# Patient Record
Sex: Male | Born: 1960
Health system: Southern US, Community
[De-identification: ages and names within clinical notes are randomized; demographics above are authoritative.]

## PROBLEM LIST (undated history)

## (undated) DIAGNOSIS — K219 Gastro-esophageal reflux disease without esophagitis: Secondary | ICD-10-CM

## (undated) DIAGNOSIS — I10 Essential (primary) hypertension: Secondary | ICD-10-CM

## (undated) DIAGNOSIS — F32A Depression, unspecified: Secondary | ICD-10-CM

## (undated) DIAGNOSIS — Z87442 Personal history of urinary calculi: Secondary | ICD-10-CM

## (undated) DIAGNOSIS — E785 Hyperlipidemia, unspecified: Secondary | ICD-10-CM

## (undated) DIAGNOSIS — F329 Major depressive disorder, single episode, unspecified: Secondary | ICD-10-CM

## (undated) DIAGNOSIS — M545 Low back pain, unspecified: Secondary | ICD-10-CM

## (undated) HISTORY — DX: Depression, unspecified: F32.A

## (undated) HISTORY — DX: Gastro-esophageal reflux disease without esophagitis: K21.9

## (undated) HISTORY — PX: COLONOSCOPY: SHX174

## (undated) HISTORY — DX: Low back pain, unspecified: M54.50

## (undated) HISTORY — DX: Hyperlipidemia, unspecified: E78.5

## (undated) HISTORY — DX: Major depressive disorder, single episode, unspecified: F32.9

## (undated) HISTORY — DX: Personal history of urinary calculi: Z87.442

---

## 2005-11-30 ENCOUNTER — Ambulatory Visit: Payer: Self-pay | Admitting: Internal Medicine

## 2005-12-07 ENCOUNTER — Ambulatory Visit: Payer: Self-pay | Admitting: Internal Medicine

## 2006-04-04 ENCOUNTER — Ambulatory Visit: Payer: Self-pay | Admitting: Internal Medicine

## 2009-03-02 ENCOUNTER — Encounter: Payer: Self-pay | Admitting: Internal Medicine

## 2009-05-26 ENCOUNTER — Ambulatory Visit: Payer: Self-pay | Admitting: Internal Medicine

## 2009-05-26 DIAGNOSIS — N410 Acute prostatitis: Secondary | ICD-10-CM | POA: Insufficient documentation

## 2009-05-26 DIAGNOSIS — Z87442 Personal history of urinary calculi: Secondary | ICD-10-CM | POA: Insufficient documentation

## 2009-07-22 ENCOUNTER — Encounter: Payer: Self-pay | Admitting: Internal Medicine

## 2009-08-25 ENCOUNTER — Ambulatory Visit (HOSPITAL_COMMUNITY): Admission: RE | Admit: 2009-08-25 | Discharge: 2009-08-25 | Payer: Self-pay | Admitting: Urology

## 2009-08-25 ENCOUNTER — Encounter: Payer: Self-pay | Admitting: Internal Medicine

## 2009-09-11 HISTORY — PX: PARTIAL NEPHRECTOMY: SHX414

## 2009-09-21 ENCOUNTER — Encounter (INDEPENDENT_AMBULATORY_CARE_PROVIDER_SITE_OTHER): Payer: Self-pay | Admitting: Urology

## 2009-09-21 ENCOUNTER — Inpatient Hospital Stay (HOSPITAL_COMMUNITY): Admission: RE | Admit: 2009-09-21 | Discharge: 2009-09-28 | Payer: Self-pay | Admitting: Urology

## 2009-10-01 ENCOUNTER — Encounter: Payer: Self-pay | Admitting: Internal Medicine

## 2009-12-21 ENCOUNTER — Encounter: Payer: Self-pay | Admitting: Internal Medicine

## 2010-02-22 ENCOUNTER — Ambulatory Visit: Payer: Self-pay | Admitting: Internal Medicine

## 2010-02-22 DIAGNOSIS — F329 Major depressive disorder, single episode, unspecified: Secondary | ICD-10-CM | POA: Insufficient documentation

## 2010-02-22 DIAGNOSIS — F3289 Other specified depressive episodes: Secondary | ICD-10-CM | POA: Insufficient documentation

## 2010-02-28 ENCOUNTER — Telehealth: Payer: Self-pay | Admitting: Internal Medicine

## 2010-03-09 ENCOUNTER — Telehealth: Payer: Self-pay | Admitting: Internal Medicine

## 2010-03-11 ENCOUNTER — Ambulatory Visit: Payer: Self-pay | Admitting: Internal Medicine

## 2010-03-11 DIAGNOSIS — N529 Male erectile dysfunction, unspecified: Secondary | ICD-10-CM | POA: Insufficient documentation

## 2010-05-18 ENCOUNTER — Ambulatory Visit: Payer: Self-pay | Admitting: Internal Medicine

## 2010-09-02 ENCOUNTER — Ambulatory Visit: Payer: Self-pay | Admitting: Family Medicine

## 2010-09-02 DIAGNOSIS — IMO0002 Reserved for concepts with insufficient information to code with codable children: Secondary | ICD-10-CM

## 2010-09-02 DIAGNOSIS — M25569 Pain in unspecified knee: Secondary | ICD-10-CM | POA: Insufficient documentation

## 2010-09-02 DIAGNOSIS — M159 Polyosteoarthritis, unspecified: Secondary | ICD-10-CM | POA: Insufficient documentation

## 2010-10-03 ENCOUNTER — Ambulatory Visit
Admission: RE | Admit: 2010-10-03 | Discharge: 2010-10-03 | Payer: Self-pay | Source: Home / Self Care | Attending: Family Medicine | Admitting: Family Medicine

## 2010-10-11 NOTE — Letter (Signed)
Summary: [facility]  [facility]   Imported By: Lanelle Bal 01/04/2010 09:50:55  _____________________________________________________________________  External Attachment:    Type:   Image     Comment:   External Document  Appended Document: [facility] healed from oncocytoma surgery checking testosterone and considering replacement therapy

## 2010-10-11 NOTE — Progress Notes (Signed)
Summary: CITALOPRAM HYDROBROMIDE   Phone Note Call from Patient Call back at Home Phone [phone]   Caller: Patient Call For: Cindee Salt MD Summary of Call: Patient has been taking CITALOPRAM HYDROBROMIDE. He does not like the way it make him feel. He has not motivation to do anything. He has been sweating alot without even doing anything. Wants to know if he can try something different. [facility]  Initial call taken by: Melody Comas,  February 28, 2010 10:06 AM  Follow-up for Phone Call        add citalopram to allergy list--just side effects though, not allergy  have him try fluoxetine 10mg  qAM keep follow up even if he only takes this for 2 weeks #30 x 3 Follow-up by: Cindee Salt MD,  February 28, 2010 1:11 PM  Additional Follow-up for Phone Call Additional follow up Details #1::        left message on machine that rx was called in and to keep his follow-up appt. Additional Follow-up by: Mervin Hack CMA Duncan Dull),  February 28, 2010 3:19 PM   New Allergies: CITALOPRAM HYDROBROMIDE (CITALOPRAM HYDROBROMIDE) New/Updated Medications: FLUOXETINE HCL 10 MG CAPS (FLUOXETINE HCL) take 1 by mouth once daily New Allergies: CITALOPRAM HYDROBROMIDE (CITALOPRAM HYDROBROMIDE)Prescriptions: FLUOXETINE HCL 10 MG CAPS (FLUOXETINE HCL) take 1 by mouth once daily  #30 x 3   Entered by:   Mervin Hack CMA (AAMA)   Authorized by:   Cindee Salt MD   Signed by:   Mervin Hack CMA (AAMA) on 02/28/2010   Method used:   [facility]        [facility] [facility]retail)       [address]       [place], [place]  [address]       Ph: [phone] or [phone]       Fax: [phone]   RxID:   [id number]   Prior Medications: TYLENOL PM EXTRA STRENGTH 500-25 MG TABS (DIPHENHYDRAMINE-APAP (SLEEP)) as needed Current Allergies: CITALOPRAM HYDROBROMIDE (CITALOPRAM HYDROBROMIDE)
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
## 2010-10-11 NOTE — Progress Notes (Signed)
Summary: not doing well with medicine  Phone Note Call from Patient Call back at Home Phone [phone]   Caller: Patient Call For: Cindee Salt MD Summary of Call: Pt stopped celexa last week and started generic prozac.  This week he feels overwhelmed, stressed.  He is asking if he should stop the medicine.  He has appt to see you on friday. Initial call taken by: Lowella Petties CMA,  March 09, 2010 9:16 AM  Follow-up for Phone Call        have him stop and we will discuss this on Friday Follow-up by: Cindee Salt MD,  March 09, 2010 10:47 AM  Additional Follow-up for Phone Call Additional follow up Details #1::        Spoke with patient and advised results.  Additional Follow-up by: Mervin Hack CMA Duncan Dull),  March 09, 2010 12:36 PM
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
## 2010-10-13 NOTE — Assessment & Plan Note (Signed)
Summary: 1 MONTH FOLLOW UP/RBH   Vital Signs:  Patient profile:   50 year old male Height:      68 inches Weight:      213.75 pounds BMI:     32.62 Temp:     98.6 degrees F oral Pulse rate:   72 / minute Pulse rhythm:   regular BP sitting:   110 / 70  (right arm) Cuff size:   large  Vitals Entered By: Benny Lennert CMA Duncan Dull) (October 03, 2010 9:29 AM)  History of Present Illness: Chief complaint 1 month up  L knee, f/u inj:  Doing OK No swelling, no pain.   Doing some squats, some leg ext, leg curls.    Essentially better, no real complaints, some weakness in his leg compared to the other, but much, much improved  REVIEW OF SYSTEMS  GEN: No systemic complaints, no fevers, chills, sweats, or other acute illnesses MSK: Detailed in the HPI GI: tolerating PO intake without difficulty Neuro: No numbness, parasthesias, or tingling associated. Otherwise the pertinent positives of the ROS are noted above.    GEN: Well-developed,well-nourished,in no acute distress; alert,appropriate and cooperative throughout examination HEENT: Normocephalic and atraumatic without obvious abnormalities. No apparent alopecia or balding. Ears, externally no deformities PULM: Breathing comfortably in no respiratory distress EXT: No clubbing, cyanosis, or edema PSYCH: Normally interactive. Cooperative during the interview. Pleasant. Friendly and conversant. Not anxious or depressed appearing. Normal, full affect.   Gait: Normal heel toe pattern ROM: WNL Effusion: neg Echymosis or edema: none Patellar tendon NT Painful PLICA: neg Patellar grind: negative Medial and lateral patellar facet loading: negative medial and lateral joint lines:NT Mcmurray's neg Flexion-pinch neg Varus and valgus stress: stable Lachman: neg Ant and Post drawer: neg Hip abduction, IR, ER: WNL Hip flexion str: 5/5 Hip abd: 5/5 Quad: 5/5 VMO atrophy:No Hamstring concentric and eccentric: 5/5   Allergies: 1)   Citalopram Hydrobromide (Citalopram Hydrobromide)   Impression & Recommendations:  Problem # 1:  KNEE PAIN, LEFT, ACUTE (ICD-719.46) Assessment Improved Dramatically better Prob OA flare  His updated medication list for this problem includes:    Diclofenac Sodium 75 Mg Tbec (Diclofenac sodium) .Marland Kitchen... 1 by mouth two times a day with food  Problem # 2:  ARTHRITIS, KNEE (ICD-716.96)  Complete Medication List: 1)  Diclofenac Sodium 75 Mg Tbec (Diclofenac sodium) .Marland Kitchen.. 1 by mouth two times a day with food   Orders Added: 1)  Est. Patient Level III [[address]]    Current Allergies (reviewed today): CITALOPRAM HYDROBROMIDE (CITALOPRAM HYDROBROMIDE)

## 2010-11-07 ENCOUNTER — Encounter: Payer: Self-pay | Admitting: Internal Medicine

## 2010-11-07 DIAGNOSIS — F329 Major depressive disorder, single episode, unspecified: Secondary | ICD-10-CM

## 2010-11-07 DIAGNOSIS — F32A Depression, unspecified: Secondary | ICD-10-CM

## 2010-11-07 DIAGNOSIS — Z87442 Personal history of urinary calculi: Secondary | ICD-10-CM

## 2010-11-21 ENCOUNTER — Ambulatory Visit: Payer: Self-pay | Admitting: Internal Medicine

## 2010-11-23 ENCOUNTER — Encounter: Payer: Self-pay | Admitting: Internal Medicine

## 2010-11-27 LAB — CBC
Hemoglobin: 16.4 g/dL (ref 13.0–17.0)
MCHC: 33.5 g/dL (ref 30.0–36.0)
MCV: 93.5 fL (ref 78.0–100.0)
RBC: 5.24 MIL/uL (ref 4.22–5.81)

## 2010-11-27 LAB — BASIC METABOLIC PANEL
BUN: 7 mg/dL (ref 6–23)
BUN: 8 mg/dL (ref 6–23)
CO2: 26 mEq/L (ref 19–32)
CO2: 29 mEq/L (ref 19–32)
Calcium: 8.1 mg/dL — ABNORMAL LOW (ref 8.4–10.5)
Calcium: 8.2 mg/dL — ABNORMAL LOW (ref 8.4–10.5)
Calcium: 8.3 mg/dL — ABNORMAL LOW (ref 8.4–10.5)
Calcium: 8.4 mg/dL (ref 8.4–10.5)
Calcium: 9.3 mg/dL (ref 8.4–10.5)
Chloride: 106 mEq/L (ref 96–112)
Creatinine, Ser: 1.17 mg/dL (ref 0.4–1.5)
Creatinine, Ser: 1.36 mg/dL (ref 0.4–1.5)
Creatinine, Ser: 1.44 mg/dL (ref 0.4–1.5)
Creatinine, Ser: 1.45 mg/dL (ref 0.4–1.5)
GFR calc Af Amer: >60 mL/min (ref >60)
GFR calc Af Amer: >60 mL/min (ref >60)
GFR calc Af Amer: >60 mL/min (ref >60)
GFR calc non Af Amer: 52 mL/min — ABNORMAL LOW (ref >60)
GFR calc non Af Amer: 52 mL/min — ABNORMAL LOW (ref >60)
GFR calc non Af Amer: 55 mL/min — ABNORMAL LOW (ref >60)
GFR calc non Af Amer: >60 mL/min (ref >60)
Glucose, Bld: 99 mg/dL (ref 70–99)
Potassium: 4.6 mEq/L (ref 3.5–5.1)
Sodium: 136 mEq/L (ref 135–145)

## 2010-11-27 LAB — HEMOGLOBIN AND HEMATOCRIT, BLOOD
HCT: 42 % (ref 39.0–52.0)
HCT: 44.6 % (ref 39.0–52.0)
Hemoglobin: 14.5 g/dL (ref 13.0–17.0)
Hemoglobin: 15.3 g/dL (ref 13.0–17.0)

## 2010-11-27 LAB — CREATININE, FLUID (PLEURAL, PERITONEAL, JP DRAINAGE): Creat, Fluid: 1.3 mg/dL

## 2010-11-27 LAB — TYPE AND SCREEN: Antibody Screen: NEGATIVE

## 2010-11-28 ENCOUNTER — Encounter: Payer: Self-pay | Admitting: Internal Medicine

## 2010-11-28 LAB — CBC
HCT: 39 % (ref 39.0–52.0)
Hemoglobin: 13.4 g/dL (ref 13.0–17.0)
MCHC: 33.7 g/dL (ref 30.0–36.0)
MCHC: 34.3 g/dL (ref 30.0–36.0)
Platelets: 211 10*3/uL (ref 150–400)
RBC: 4.25 MIL/uL (ref 4.22–5.81)
RDW: 12.3 % (ref 11.5–15.5)
RDW: 12.9 % (ref 11.5–15.5)

## 2010-11-28 LAB — COMPREHENSIVE METABOLIC PANEL
ALT: 50 U/L (ref 0–53)
Alkaline Phosphatase: 64 U/L (ref 39–117)
BUN: 10 mg/dL (ref 6–23)
CO2: 24 mEq/L (ref 19–32)
GFR calc non Af Amer: >60 mL/min (ref >60)
Glucose, Bld: 100 mg/dL — ABNORMAL HIGH (ref 70–99)
Potassium: 3.1 mEq/L — ABNORMAL LOW (ref 3.5–5.1)
Sodium: 135 mEq/L (ref 135–145)
Total Protein: 5.6 g/dL — ABNORMAL LOW (ref 6.0–8.3)

## 2010-11-28 LAB — CLOSTRIDIUM DIFFICILE EIA: C difficile Toxins A+B, EIA: NEGATIVE

## 2010-11-28 LAB — DIFFERENTIAL
Basophils Absolute: 0 10*3/uL (ref 0.0–0.1)
Basophils Relative: 1 % (ref 0–1)
Eosinophils Absolute: 0.1 10*3/uL (ref 0.0–0.7)
Monocytes Relative: 11 % (ref 3–12)
Neutro Abs: 6.6 10*3/uL (ref 1.7–7.7)
Neutrophils Relative %: 72 % (ref 43–77)

## 2010-11-28 LAB — CREATININE, SERUM
Creatinine, Ser: 1.21 mg/dL (ref 0.4–1.5)
GFR calc non Af Amer: >60 mL/min (ref >60)

## 2010-11-29 ENCOUNTER — Ambulatory Visit (INDEPENDENT_AMBULATORY_CARE_PROVIDER_SITE_OTHER): Payer: PRIVATE HEALTH INSURANCE | Admitting: Internal Medicine

## 2010-11-29 ENCOUNTER — Encounter: Payer: Self-pay | Admitting: Internal Medicine

## 2010-11-29 VITALS — BP 110/60 | HR 80 | Temp 98.6°F | Wt 206.0 lb

## 2010-11-29 DIAGNOSIS — F329 Major depressive disorder, single episode, unspecified: Secondary | ICD-10-CM

## 2010-11-29 DIAGNOSIS — F3289 Other specified depressive episodes: Secondary | ICD-10-CM

## 2010-11-29 NOTE — Assessment & Plan Note (Signed)
Mood is better now Less stress at work with new assistant Occ sleep problems--discussed OTC meds Clearly this requires no Rx
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
## 2010-11-29 NOTE — Patient Instructions (Signed)
Please try melatonin at night if you continue to have sleep problems

## 2011-06-01 ENCOUNTER — Ambulatory Visit (INDEPENDENT_AMBULATORY_CARE_PROVIDER_SITE_OTHER): Payer: PRIVATE HEALTH INSURANCE | Admitting: Internal Medicine

## 2011-06-01 ENCOUNTER — Encounter: Payer: Self-pay | Admitting: Internal Medicine

## 2011-06-01 VITALS — BP 129/61 | HR 74 | Temp 98.5°F | Ht 68.0 in | Wt 211.0 lb

## 2011-06-01 DIAGNOSIS — F329 Major depressive disorder, single episode, unspecified: Secondary | ICD-10-CM

## 2011-06-01 DIAGNOSIS — F3289 Other specified depressive episodes: Secondary | ICD-10-CM

## 2011-06-01 DIAGNOSIS — Z0000 Encounter for general adult medical examination without abnormal findings: Secondary | ICD-10-CM

## 2011-06-01 NOTE — Progress Notes (Signed)
Subjective:    Patient ID: Harold Li, male    DOB: Jul 27, 1961, 50 y.o.   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 sildenafil (VIAGRA) 100 MG tablet Take 50-100 mg by mouth daily as needed. Take 30-60 minutes before sex         Allergies  Allergen Reactions  . Citalopram Hydrobromide     REACTION: no energy, weak    Past Medical History  Diagnosis Date  . Depression   . History of nephrolithiasis     Dr. Earlene Plater    Past Surgical History  Procedure Date  . 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 Coronary artery disease Father   . Arthritis Father   . Diabetes Mother   . Arthritis Mother     History   Social History  . Marital Status: Single    Spouse Name: N/A    Number of Children: N/A  . Years of Education: N/A   Occupational History  . 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 Smoking status: Former Smoker    Types: Cigarettes    Quit date: 08/11/2009  . Smokeless tobacco: Never Used  . Alcohol Use: Yes  . Drug Use: Not on file  . Sexually Active: Not on file   Other Topics Concern  . Not on file   Social History Narrative  . No narrative on file   Review of Systems  Constitutional: Negative for fatigue and unexpected weight change.       Wears seat belt  HENT: Negative for hearing loss, congestion, rhinorrhea, dental problem and tinnitus.        Regular with dentist  Eyes: Negative for visual disturbance.       No diplopia or unilateral vision loss  Respiratory: Negative for cough, chest tightness and shortness of breath.   Cardiovascular: Positive for palpitations. Negative for chest pain and leg swelling.    Rare heart fluttering when at rest---goes back many years [facility] regularly [facility] [employer]---feels his fitness is fine  Gastrointestinal: Negative for nausea, vomiting, abdominal pain, constipation and blood in stool.       No heartburn  Genitourinary: Negative for dysuria, frequency and difficulty urinating.       ED not a current issue  Musculoskeletal: Positive for back pain and arthralgias. Negative for joint swelling.       Some knee and back pain Happy with chiropractor  Skin: Negative for rash.       Sees derm yearly  Neurological: Negative for dizziness, syncope, weakness, light-headedness, numbness and headaches.  Hematological: Negative for adenopathy. Does not bruise/bleed easily.  Psychiatric/Behavioral: Negative for sleep disturbance and dysphoric mood. The patient is not nervous/anxious.        Objective:   Physical Exam  Constitutional: He is oriented to person, place, and time. He appears well-developed and well-nourished. No distress.  HENT:  Head: Normocephalic and atraumatic.  Right Ear: External ear normal.  Left Ear: External ear normal.  Mouth/Throat: Oropharynx is clear and moist. No oropharyngeal exudate.       TMs normal  Eyes: Conjunctivae and EOM are normal. Pupils are  equal, round, and reactive to light.       Fundi benign  Neck: Normal range of motion. Neck supple. No thyromegaly present.  Cardiovascular: Normal rate, regular rhythm, normal heart sounds and intact distal pulses.  Exam reveals no gallop.   No murmur heard. Pulmonary/Chest: Effort normal and breath sounds normal. No respiratory distress. He has no wheezes. He has no rales.  Abdominal: Soft. There is no tenderness.  Musculoskeletal: Normal range of motion. He exhibits no edema and no tenderness.  Lymphadenopathy:    He has no cervical adenopathy.  Neurological: He is alert and oriented to person, place, and time. He exhibits normal muscle tone.       Normal strength and gait  Skin: Skin is  warm. No rash noted.  Psychiatric: He has a normal mood and affect. His behavior is normal. Judgment and thought content normal.          Assessment & Plan:

## 2011-06-01 NOTE — Patient Instructions (Signed)
Please do the stool test and send it back

## 2011-06-01 NOTE — Assessment & Plan Note (Signed)
Healthy Doing well Requests stool immunoassay due to cost (has high deductible) Discussed PSA--will check

## 2011-06-01 NOTE — Assessment & Plan Note (Signed)
Better now No seasonality now No Rx  Check labs

## 2011-06-02 ENCOUNTER — Other Ambulatory Visit: Payer: PRIVATE HEALTH INSURANCE

## 2011-06-02 ENCOUNTER — Telehealth: Payer: Self-pay | Admitting: Family Medicine

## 2011-06-02 LAB — CBC WITH DIFFERENTIAL/PLATELET
Basophils Relative: 0.5 % (ref 0.0–3.0)
Eosinophils Absolute: 0.1 10*3/uL (ref 0.0–0.7)
Eosinophils Relative: 1.9 % (ref 0.0–5.0)
HCT: 48.4 % (ref 39.0–52.0)
Hemoglobin: 15.9 g/dL (ref 13.0–17.0)
MCHC: 32.9 g/dL (ref 30.0–36.0)
MCV: 92.9 fl (ref 78.0–100.0)
Monocytes Absolute: 0.4 10*3/uL (ref 0.1–1.0)
Neutro Abs: 3.2 10*3/uL (ref 1.4–7.7)
Neutrophils Relative %: 58.2 % (ref 43.0–77.0)
RBC: 5.21 Mil/uL (ref 4.22–5.81)
WBC: 5.6 10*3/uL (ref 4.5–10.5)

## 2011-06-02 LAB — BASIC METABOLIC PANEL
CO2: 28 mEq/L (ref 19–32)
Chloride: 104 mEq/L (ref 96–112)
Creatinine, Ser: 1.2 mg/dL (ref 0.4–1.5)
Potassium: 4.5 mEq/L (ref 3.5–5.1)
Sodium: 140 mEq/L (ref 135–145)

## 2011-06-02 LAB — HEPATIC FUNCTION PANEL
ALT: 28 U/L (ref 0–53)
AST: 30 U/L (ref 0–37)
Total Bilirubin: 0.5 mg/dL (ref 0.3–1.2)
Total Protein: 7.3 g/dL (ref 6.0–8.3)

## 2011-06-02 LAB — LIPID PANEL: Total CHOL/HDL Ratio: 5

## 2011-06-02 LAB — LDL CHOLESTEROL, DIRECT: Direct LDL: 164.5 mg/dL

## 2011-06-02 LAB — PSA: PSA: 0.54 ng/mL (ref 0.10–4.00)

## 2011-06-02 NOTE — Telephone Encounter (Signed)
Message copied by Judy Pimple on Fri Jun 02, 2011  8:14 AM ------      Message from: Baldomero Lamy      Created: Fri Jun 02, 2011  7:56 AM      Regarding: Cpx labs this am       Pt scheduled for cpx labs this am please order      Thanks      Rodney Booze

## 2011-06-08 ENCOUNTER — Other Ambulatory Visit: Payer: Self-pay | Admitting: Internal Medicine

## 2011-06-08 ENCOUNTER — Other Ambulatory Visit: Payer: PRIVATE HEALTH INSURANCE

## 2011-06-08 DIAGNOSIS — Z1211 Encounter for screening for malignant neoplasm of colon: Secondary | ICD-10-CM

## 2011-06-12 ENCOUNTER — Encounter: Payer: Self-pay | Admitting: *Deleted

## 2013-08-20 ENCOUNTER — Encounter: Payer: Self-pay | Admitting: Internal Medicine

## 2013-08-20 ENCOUNTER — Ambulatory Visit (INDEPENDENT_AMBULATORY_CARE_PROVIDER_SITE_OTHER): Payer: Managed Care, Other (non HMO) | Admitting: Internal Medicine

## 2013-08-20 VITALS — BP 126/78 | HR 97 | Temp 98.9°F | Wt 218.2 lb

## 2013-08-20 DIAGNOSIS — J019 Acute sinusitis, unspecified: Secondary | ICD-10-CM

## 2013-08-20 MED ORDER — AMOXICILLIN 500 MG PO TABS: 1000.0000 mg | ORAL_TABLET | Freq: Two times a day (BID) | ORAL | Status: DC

## 2013-08-20 NOTE — Progress Notes (Signed)
   Subjective:    Patient ID: Harold Li, male    DOB: June 26, 1961, 52 y.o.   MRN: [id number]  HPI Has a bad cold 3rd time recently Had in October--seemed to be better but still congested Then bad again in November Now back again  Congestion Dry cough Slight sore throat Does have post nasal drip No ear pain Some fever--- one day No real SOB Some myalgia now  Using alka seltzer plus-- day and night (slight help)   Current Outpatient Prescriptions on File Prior to Visit  Medication Sig Dispense Refill  . sildenafil (VIAGRA) 100 MG tablet Take 50-100 mg by mouth daily as needed. Take 30-60 minutes before sex        No current facility-administered medications on file prior to visit.    Allergies  Allergen Reactions  . Citalopram Hydrobromide     REACTION: no energy, weak    Past Medical History  Diagnosis Date  . Depression   . History of nephrolithiasis     Dr. Earlene Plater    Past Surgical History  Procedure Laterality Date  . Partial nephrectomy  09/2009    for oncocytoma    Family History  Problem Relation Age of Onset  . Coronary artery disease Father   . Arthritis Father   . Diabetes Mother   . Arthritis Mother     History   Social History  . Marital Status: Single    Spouse Name: N/A    Number of Children: N/A  . Years of Education: N/A   Occupational History  . 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 Smoking status: Former Smoker    Types: Cigarettes    Quit date: 08/11/2009  . Smokeless tobacco: Never Used  . Alcohol Use: Yes  . Drug Use: Not on file  . Sexual Activity: Not on file   Other Topics Concern  . Not on file   Social History Narrative  . No narrative on file   Review of Systems No rash No vomiting or diarrhea Appetite is good     Objective:   Physical Exam  Constitutional: He appears well-developed and well-nourished. No distress.  HENT:  Mouth/Throat: Oropharynx is clear and moist. No  oropharyngeal exudate.  No sinus tenderness Moderate nasal inflammation TMs normal  Neck: Normal range of motion. Neck supple.  Pulmonary/Chest: Effort normal and breath sounds normal. No respiratory distress. He has no wheezes. He has no rales.  Lymphadenopathy:    He has no cervical adenopathy.          Assessment & Plan:

## 2013-08-20 NOTE — Assessment & Plan Note (Signed)
Recurrent over 2 months Discussed symptomatic Rx Will treat with amoxicillin

## 2013-08-20 NOTE — Progress Notes (Signed)
Pre-visit discussion using our clinic review tool. No additional management support is needed unless otherwise documented below in the visit note.  

## 2014-01-19 ENCOUNTER — Encounter: Payer: Self-pay | Admitting: Internal Medicine

## 2014-01-19 ENCOUNTER — Ambulatory Visit (INDEPENDENT_AMBULATORY_CARE_PROVIDER_SITE_OTHER): Payer: Managed Care, Other (non HMO) | Admitting: Internal Medicine

## 2014-01-19 ENCOUNTER — Encounter: Payer: Self-pay | Admitting: Gastroenterology

## 2014-01-19 VITALS — BP 118/70 | HR 57 | Temp 98.6°F | Ht 68.0 in | Wt 192.0 lb

## 2014-01-19 DIAGNOSIS — Z0000 Encounter for general adult medical examination without abnormal findings: Secondary | ICD-10-CM

## 2014-01-19 DIAGNOSIS — Z1211 Encounter for screening for malignant neoplasm of colon: Secondary | ICD-10-CM

## 2014-01-19 DIAGNOSIS — E785 Hyperlipidemia, unspecified: Secondary | ICD-10-CM

## 2014-01-19 DIAGNOSIS — Z125 Encounter for screening for malignant neoplasm of prostate: Secondary | ICD-10-CM

## 2014-01-19 LAB — LIPID PANEL
Cholesterol: 196 mg/dL (ref 0–200)
HDL: 57.7 mg/dL (ref >39.00)
LDL CALC: 127 mg/dL — AB (ref 0–99)
Total CHOL/HDL Ratio: 3
Triglycerides: 58 mg/dL (ref 0.0–149.0)
VLDL: 11.6 mg/dL (ref 0.0–40.0)

## 2014-01-19 LAB — T4, FREE: FREE T4: 0.91 ng/dL (ref 0.60–1.60)

## 2014-01-19 LAB — TSH: TSH: 1.42 u[IU]/mL (ref 0.35–4.50)

## 2014-01-19 LAB — COMPREHENSIVE METABOLIC PANEL
ALK PHOS: 72 U/L (ref 39–117)
ALT: 53 U/L (ref 0–53)
AST: 38 U/L — AB (ref 0–37)
Albumin: 4.4 g/dL (ref 3.5–5.2)
BILIRUBIN TOTAL: 0.7 mg/dL (ref 0.2–1.2)
BUN: 19 mg/dL (ref 6–23)
CO2: 29 meq/L (ref 19–32)
Calcium: 9.6 mg/dL (ref 8.4–10.5)
Chloride: 103 mEq/L (ref 96–112)
Creatinine, Ser: 1.1 mg/dL (ref 0.4–1.5)
GFR: 77.95 mL/min (ref >60.00)
Glucose, Bld: 86 mg/dL (ref 70–99)
Potassium: 4.6 mEq/L (ref 3.5–5.1)
SODIUM: 139 meq/L (ref 135–145)
TOTAL PROTEIN: 6.9 g/dL (ref 6.0–8.3)

## 2014-01-19 LAB — PSA: PSA: 1 ng/mL (ref 0.10–4.00)

## 2014-01-19 LAB — CBC WITH DIFFERENTIAL/PLATELET
BASOS ABS: 0 10*3/uL (ref 0.0–0.1)
Basophils Relative: 0.5 % (ref 0.0–3.0)
EOS ABS: 0.1 10*3/uL (ref 0.0–0.7)
Eosinophils Relative: 2 % (ref 0.0–5.0)
HEMATOCRIT: 45.3 % (ref 39.0–52.0)
Hemoglobin: 15.4 g/dL (ref 13.0–17.0)
LYMPHS ABS: 2.1 10*3/uL (ref 0.7–4.0)
Lymphocytes Relative: 32.5 % (ref 12.0–46.0)
MCHC: 34 g/dL (ref 30.0–36.0)
MCV: 90.9 fl (ref 78.0–100.0)
MONO ABS: 0.5 10*3/uL (ref 0.1–1.0)
Monocytes Relative: 7.2 % (ref 3.0–12.0)
NEUTROS ABS: 3.7 10*3/uL (ref 1.4–7.7)
Neutrophils Relative %: 57.8 % (ref 43.0–77.0)
PLATELETS: 232 10*3/uL (ref 150.0–400.0)
RBC: 4.99 Mil/uL (ref 4.22–5.81)
RDW: 13 % (ref 11.5–15.5)
WBC: 6.5 10*3/uL (ref 4.0–10.5)

## 2014-01-19 NOTE — Progress Notes (Signed)
Subjective:    Patient ID: Harold Li, male    DOB: 02/18/1961, 53 y.o.   MRN: [id number]  HPI Doing well  Has started again with [employer] Goes to gym 5 days per week Has lost 26# since the last visit  Same job Usual stress but no big deal  Some knee pain from arthritis Moderates exercise depending on how much it hurts Uses ibuprofen if the pain is bad  Current Outpatient Prescriptions on File Prior to Visit  Medication Sig Dispense Refill  . sildenafil (VIAGRA) 100 MG tablet Take 50-100 mg by mouth daily as needed. Take 30-60 minutes before sex        No current facility-administered medications on file prior to visit.    Allergies  Allergen Reactions  . Citalopram Hydrobromide     REACTION: no energy, weak    Past Medical History  Diagnosis Date  . Depression   . History of nephrolithiasis     Dr. Rosana Hoes  . Hyperlipidemia     Past Surgical History  Procedure Laterality Date  . Partial nephrectomy  09/2009    for oncocytoma    Family History  Problem Relation Age of Onset  . Coronary artery disease Father   . Arthritis Father   . Stroke Father     in eye  . Diabetes Mother   . Arthritis Mother     History   Social History  . Marital Status: Single    Spouse Name: N/A    Number of Children: N/A  . Years of Education: N/A   Occupational History  . 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 Smoking status: Former Smoker    Types: Cigarettes    Quit date: 08/11/2009  . Smokeless tobacco: Never Used  . Alcohol Use: Yes  . Drug Use: Not on file  . Sexual Activity: Not on file   Other Topics Concern  . Not on file   Social History Narrative  . No narrative on file   Review of Systems  Constitutional: Negative for fatigue.       Wears seat belt Just got married in January  HENT: Negative for dental problem, hearing loss and tinnitus.        Regular with dentist  Eyes: Negative for visual disturbance.       No  diplopia or unilateral vision loss  Respiratory: Negative for cough, chest tightness and shortness of breath.   Cardiovascular: Positive for palpitations. Negative for chest pain and leg swelling.       Rare palpitations--at rest  Gastrointestinal: Negative for nausea, vomiting, abdominal pain, constipation and blood in stool.       Occasional heartburn--no meds  Endocrine: Negative for cold intolerance and heat intolerance.  Genitourinary: Negative for urgency, frequency and difficulty urinating.       Libido down since his kidney surgery  Musculoskeletal: Positive for arthralgias. Negative for back pain and joint swelling.  Skin: Negative for rash.       Sensitive to sun Sees derm every 6 months  Allergic/Immunologic: Negative for environmental allergies and immunocompromised state.  Neurological: Negative for dizziness, syncope, weakness, light-headedness, numbness and headaches.  Hematological: Negative for adenopathy. Bruises/bleeds easily.  Psychiatric/Behavioral: Negative for sleep disturbance and dysphoric mood. The patient is not nervous/anxious.        Objective:   Physical Exam  Constitutional: He is oriented to person, place, and time. He appears well-developed and well-nourished. No distress.  HENT:  Head: Normocephalic and atraumatic.  Right Ear: External ear normal.  Left Ear: External ear normal.  Mouth/Throat: Oropharynx is clear and moist. No oropharyngeal exudate.  Eyes: Conjunctivae and EOM are normal. Pupils are equal, round, and reactive to light.  Neck: Normal range of motion. Neck supple. No thyromegaly present.  Cardiovascular: Normal rate, regular rhythm, normal heart sounds and intact distal pulses.   No murmur heard. Pulmonary/Chest: Effort normal and breath sounds normal. No respiratory distress. He has no wheezes. He has no rales.  Abdominal: Soft. There is no tenderness.  Musculoskeletal: He exhibits no edema and no tenderness.  Lymphadenopathy:     He has no cervical adenopathy.  Neurological: He is alert and oriented to person, place, and time.  Skin: No rash noted. No erythema.  Psychiatric: He has a normal mood and affect. His behavior is normal.          Assessment & Plan:

## 2014-01-19 NOTE — Assessment & Plan Note (Signed)
Will recheck given his lifestyle changes

## 2014-01-19 NOTE — Progress Notes (Signed)
Pre visit review using our clinic review tool, if applicable. No additional management support is needed unless otherwise documented below in the visit note. 

## 2014-01-19 NOTE — Assessment & Plan Note (Signed)
Has done great work on fitness Will check sugar

## 2014-03-09 ENCOUNTER — Encounter: Payer: Managed Care, Other (non HMO) | Admitting: Gastroenterology

## 2014-03-26 ENCOUNTER — Ambulatory Visit (AMBULATORY_SURGERY_CENTER): Payer: Self-pay

## 2014-03-26 VITALS — Ht 68.0 in | Wt 186.0 lb

## 2014-03-26 DIAGNOSIS — Z1211 Encounter for screening for malignant neoplasm of colon: Secondary | ICD-10-CM

## 2014-03-26 MED ORDER — MOVIPREP 100 G PO SOLR: 1.0000 | Freq: Once | ORAL | Status: DC

## 2014-03-26 NOTE — Progress Notes (Signed)
No allergies to eggs or soy No home oxygen No past problems with anesthesia No diet/weight loss meds  Has email.  Emmi instructions given for colonoscopy. 

## 2014-03-30 ENCOUNTER — Encounter: Payer: Self-pay | Admitting: Gastroenterology

## 2014-04-10 ENCOUNTER — Ambulatory Visit (AMBULATORY_SURGERY_CENTER): Payer: Managed Care, Other (non HMO) | Admitting: Gastroenterology

## 2014-04-10 ENCOUNTER — Encounter: Payer: Self-pay | Admitting: Gastroenterology

## 2014-04-10 VITALS — BP 127/83 | HR 61 | Temp 97.0°F | Resp 16 | Ht 68.0 in | Wt 186.0 lb

## 2014-04-10 DIAGNOSIS — D126 Benign neoplasm of colon, unspecified: Secondary | ICD-10-CM

## 2014-04-10 DIAGNOSIS — D124 Benign neoplasm of descending colon: Secondary | ICD-10-CM

## 2014-04-10 DIAGNOSIS — Z1211 Encounter for screening for malignant neoplasm of colon: Secondary | ICD-10-CM

## 2014-04-10 MED ORDER — SODIUM CHLORIDE 0.9 % IV SOLN: 500.0000 mL | INTRAVENOUS | Status: DC

## 2014-04-10 NOTE — Progress Notes (Signed)
Called to room to assist during endoscopic procedure.  Patient ID and intended procedure confirmed with present staff. Received instructions for my participation in the procedure from the performing physician.  

## 2014-04-10 NOTE — Patient Instructions (Signed)
YOU HAD AN ENDOSCOPIC PROCEDURE TODAY AT THE [facility]: Refer to the procedure report that was given to you for any specific questions about what was found during the examination.  If the procedure report does not answer your questions, please call your gastroenterologist to clarify.  If you requested that your care partner not be given the details of your procedure findings, then the procedure report has been included in a sealed envelope for you to review at your convenience later.  YOU SHOULD EXPECT: Some feelings of bloating in the abdomen. Passage of more gas than usual.  Walking can help get rid of the air that was put into your GI tract during the procedure and reduce the bloating. If you had a lower endoscopy (such as a colonoscopy or flexible sigmoidoscopy) you may notice spotting of blood in your stool or on the toilet paper. If you underwent a bowel prep for your procedure, then you may not have a normal bowel movement for a few days.  DIET: Your first meal following the procedure should be a light meal and then it is ok to progress to your normal diet.  A half-sandwich or bowl of soup is an example of a good first meal.  Heavy or fried foods are harder to digest and may make you feel nauseous or bloated.  Likewise meals heavy in dairy and vegetables can cause extra gas to form and this can also increase the bloating.  Drink plenty of fluids but you should avoid alcoholic beverages for 24 hours.  ACTIVITY: Your care partner should take you home directly after the procedure.  You should plan to take it easy, moving slowly for the rest of the day.  You can resume normal activity the day after the procedure however you should NOT DRIVE or use heavy machinery for 24 hours (because of the sedation medicines used during the test).    SYMPTOMS TO REPORT IMMEDIATELY: A gastroenterologist can be reached at any hour.  During normal business hours, 8:30 AM to 5:00 PM Monday through Friday,  call [phone].  After hours and on weekends, please call the GI answering service at [phone] who will take a message and have the physician on call contact you.   Following lower endoscopy (colonoscopy or flexible sigmoidoscopy):  Excessive amounts of blood in the stool  Significant tenderness or worsening of abdominal pains  Swelling of the abdomen that is new, acute  Fever of 100F or higher FOLLOW UP: If any biopsies were taken you will be contacted by phone or by letter within the next 1-3 weeks.  Call your gastroenterologist if you have not heard about the biopsies in 3 weeks.  Our staff will call the home number listed on your records the next business day following your procedure to check on you and address any questions or concerns that you may have at that time regarding the information given to you following your procedure. This is a courtesy call and so if there is no answer at the home number and we have not heard from you through the emergency physician on call, we will assume that you have returned to your regular daily activities without incident.  SIGNATURES/CONFIDENTIALITY: You and/or your care partner have signed paperwork which will be entered into your electronic medical record.  These signatures attest to the fact that that the information above on your After Visit Summary has been reviewed and is understood.  Full responsibility of the confidentiality of this discharge   information lies with you and/or your care-partner.  Polyp information given. 

## 2014-04-10 NOTE — Op Note (Signed)
[place] [facility]  [address] [place], [id number]   COLONOSCOPY PROCEDURE REPORT  PATIENT: Harold, Li  MR#: [id number] BIRTHDATE: August 06, 1961 , 37  yrs. old GENDER: Male ENDOSCOPIST: Milus Banister, MD REFERRED [id number] Silvio Pate, M.D. PROCEDURE DATE:  04/10/2014 PROCEDURE:   Colonoscopy with snare polypectomy First Screening Colonoscopy - Avg.  risk and is 50 yrs.  old or older Yes.  Prior Negative Screening - Now for repeat screening. N/A  History of Adenoma - Now for follow-up colonoscopy & has been > or = to 3 yrs.  N/A  Polyps Removed Today? Yes. ASA CLASS:   Class II INDICATIONS:average risk screening. MEDICATIONS: MAC sedation, administered by CRNA and Propofol (Diprivan) 280 mg IV  DESCRIPTION OF PROCEDURE:   After the risks benefits and alternatives of the procedure were thoroughly explained, informed consent was obtained.  A digital rectal exam revealed no abnormalities of the rectum.   The LB [id number]  endoscope was introduced through the anus and advanced to the cecum, which was identified by both the appendix and ileocecal valve. No adverse events experienced.   The quality of the prep was excellent.  The instrument was then slowly withdrawn as the colon was fully examined.   COLON FINDINGS: One polyp was found, removed and sent to pathology. This was sessile, located in descending segment 81mm across, removed with cold snare.  The examination was otherwise normal. Retroflexed views revealed no abnormalities. The time to cecum=2 minutes 48 seconds.  Withdrawal time=10 minutes 00 seconds.  The scope was withdrawn and the procedure completed. COMPLICATIONS: There were no complications.  ENDOSCOPIC IMPRESSION: One polyp was found, removed and sent to pathology. The examination was otherwise normal.  RECOMMENDATIONS: If the polyp(s) removed today are proven to be adenomatous (pre-cancerous) polyps, you will need a repeat colonoscopy in  5 years.  Otherwise you should continue to follow colorectal cancer screening guidelines for "routine risk" patients with colonoscopy in 10 years.  You will receive a letter within 1-2 weeks with the results of your biopsy as well as final recommendations.  Please call my office if you have not received a letter after 3 weeks.   eSigned:  Milus Banister, MD 04/10/2014 10:35 AM

## 2014-04-10 NOTE — Progress Notes (Signed)
Procedure ends, to recovery, report given and VSS. 

## 2014-04-13 ENCOUNTER — Telehealth: Payer: Self-pay | Admitting: *Deleted

## 2014-04-13 NOTE — Telephone Encounter (Signed)
  Follow up Call-  Call back number 04/10/2014  Post procedure Call Back phone  # [phone]  Permission to leave phone message Yes    [facility]

## 2014-04-17 ENCOUNTER — Encounter: Payer: Self-pay | Admitting: Gastroenterology

## 2014-07-07 ENCOUNTER — Ambulatory Visit (INDEPENDENT_AMBULATORY_CARE_PROVIDER_SITE_OTHER): Payer: Managed Care, Other (non HMO) | Admitting: Internal Medicine

## 2014-07-07 ENCOUNTER — Encounter: Payer: Self-pay | Admitting: Internal Medicine

## 2014-07-07 VITALS — BP 130/72 | HR 68 | Temp 98.0°F | Wt 196.0 lb

## 2014-07-07 DIAGNOSIS — R6884 Jaw pain: Secondary | ICD-10-CM

## 2014-07-07 DIAGNOSIS — M2662 Arthralgia of temporomandibular joint: Secondary | ICD-10-CM

## 2014-07-07 DIAGNOSIS — M26629 Arthralgia of temporomandibular joint, unspecified side: Secondary | ICD-10-CM

## 2014-07-07 NOTE — Progress Notes (Signed)
Subjective:    Patient ID: Harold Li, male    DOB: 1961/07/24, 53 y.o.   MRN: [id number]  HPI  Pt presents to the clinic today with c/o right side jaw pain. He reports this started about 6 weeks ago. He has pain with opening his mouth and chewing.He has not heard any popping noises. He has noticed some swelling in that area. He denies pain in his teeth. He denies fever or chills. He has had some ear pain on that side but that has resolved. He has never had anything like this before. He has not tried anything OTC.  Review of Systems      Past Medical History  Diagnosis Date  . Depression   . History of nephrolithiasis     Dr. Rosana Hoes  . Hyperlipidemia     No current outpatient prescriptions on file.   No current facility-administered medications for this visit.    Allergies  Allergen Reactions  . Citalopram Hydrobromide     REACTION: no energy, weak    Family History  Problem Relation Age of Onset  . Coronary artery disease Father   . Arthritis Father   . Stroke Father     in eye  . Diabetes Mother   . Arthritis Mother   . Colon cancer Neg Hx   . Pancreatic cancer Neg Hx   . Rectal cancer Neg Hx   . Stomach cancer Neg Hx     History   Social History  . Marital Status: Married    Spouse Name: N/A    Number of Children: N/A  . Years of Education: N/A   Occupational History  . 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 Smoking status: Former Smoker    Types: Cigarettes    Quit date: 08/11/2009  . Smokeless tobacco: Never Used  . Alcohol Use: Yes     Comment: extrememly rare about twice yearly  . Drug Use: No  . Sexual Activity: Not on file   Other Topics Concern  . Not on file   Social History Narrative   Married 1/15     Constitutional: Denies fever, malaise, fatigue, headache or abrupt weight changes.  HEENT: Denies eye pain, eye redness, ear pain, ringing in the ears, wax buildup, runny nose, nasal congestion, bloody  nose, or sore throat. Respiratory: Denies difficulty breathing, shortness of breath, cough or sputum production.   Musculoskeletal: Pt reports right side jaw pain. Denies decrease in range of motion, difficulty with gait, muscle pain.    No other specific complaints in a complete review of systems (except as listed in HPI above).  Objective:   Physical Exam   BP 130/72  Pulse 68  Temp(Src) 98 F (36.7 C) (Oral)  Wt 196 lb (88.905 kg)  SpO2 98% Wt Readings from Last 3 Encounters:  07/07/14 196 lb (88.905 kg)  04/10/14 186 lb (84.369 kg)  03/26/14 186 lb (84.369 kg)    General: Appears his stated age, well developed, well nourished in NAD. Neck: Normal range of motion. Neck supple, trachea midline. No masses, lumps or thyromegaly present.  Cardiovascular: Normal rate and rhythm. S1,S2 noted.  No murmur, rubs or gallops noted.  Pulmonary/Chest: Normal effort and positive vesicular breath sounds. No respiratory distress. No wheezes, rales or ronchi noted.  Musculoskeletal: Tender to palpation over the right TMJ. Clicking noted with opening and closing of the jaw. Mild swelling noted on the right side of the face.  BMET    Component Value Date/Time   NA 139 01/19/2014 1227   K 4.6 01/19/2014 1227   CL 103 01/19/2014 1227   CO2 29 01/19/2014 1227   GLUCOSE 86 01/19/2014 1227   BUN 19 01/19/2014 1227   CREATININE 1.1 01/19/2014 1227   CALCIUM 9.6 01/19/2014 1227   GFRNONAA >60 09/28/2009 0440   GFRAA  Value: >60        The eGFR has been calculated using the MDRD equation. This calculation has not been validated in all clinical situations. eGFR's persistently <60 mL/min signify possible Chronic Kidney Disease. 09/28/2009 0440    Lipid Panel     Component Value Date/Time   CHOL 196 01/19/2014 1227   TRIG 58.0 01/19/2014 1227   HDL 57.70 01/19/2014 1227   CHOLHDL 3 01/19/2014 1227   VLDL 11.6 01/19/2014 1227   LDLCALC 127* 01/19/2014 1227    CBC    Component Value Date/Time   WBC  6.5 01/19/2014 1227   RBC 4.99 01/19/2014 1227   HGB 15.4 01/19/2014 1227   HCT 45.3 01/19/2014 1227   PLT 232.0 01/19/2014 1227   MCV 90.9 01/19/2014 1227   MCHC 34.0 01/19/2014 1227   RDW 13.0 01/19/2014 1227   LYMPHSABS 2.1 01/19/2014 1227   MONOABS 0.5 01/19/2014 1227   EOSABS 0.1 01/19/2014 1227   BASOSABS 0.0 01/19/2014 1227    Hgb A1C No results found for this basename: HGBA1C        Assessment & Plan:   Right side jaw pain, ? TMJ:  Try wearing a mouth guard which can be purchased at your local drug store Try some Advil for the inflammation and swelling Try chewing softer foods over the next 1-2 weeks  If no improvement, follow up with PCP

## 2014-07-07 NOTE — Progress Notes (Signed)
Pre visit review using our clinic review tool, if applicable. No additional management support is needed unless otherwise documented below in the visit note. 

## 2014-07-07 NOTE — Patient Instructions (Addendum)
Temporomandibular Problems  Temporomandibular joint (TMJ) dysfunction means there are problems with the joint between your jaw and your skull. This is a joint lined by cartilage like other joints in your body but also has a small disc in the joint which keeps the bones from rubbing on each other. These joints are like other joints and can get inflamed (sore) from arthritis and other problems. When this joint gets sore, it can cause headaches and pain in the jaw and the face. CAUSES  Usually the arthritic types of problems are caused by soreness in the joint. Soreness in the joint can also be caused by overuse. This may come from grinding your teeth. It may also come from mis-alignment in the joint. DIAGNOSIS Diagnosis of this condition can often be made by history and exam. Sometimes your caregiver may need X-rays or an MRI scan to determine the exact cause. It may be necessary to see your dentist to determine if your teeth and jaws are lined up correctly. TREATMENT  Most of the time this problem is not serious; however, sometimes it can persist (become chronic). When this happens medications that will cut down on inflammation (soreness) help. Sometimes a shot of cortisone into the joint will be helpful. If your teeth are not aligned it may help for your dentist to make a splint for your mouth that can help this problem. If no physical problems can be found, the problem may come from tension. If tension is found to be the cause, biofeedback or relaxation techniques may be helpful. HOME CARE INSTRUCTIONS   Later in the day, applications of ice packs may be helpful. Ice can be used in a plastic bag with a towel around it to prevent frostbite to skin. This may be used about every 2 hours for 20 to 30 minutes, as needed while awake, or as directed by your caregiver.  Only take over-the-counter or prescription medicines for pain, discomfort, or fever as directed by your caregiver.  If physical therapy was  prescribed, follow your caregiver's directions.  Wear mouth appliances as directed if they were given. Document Released: 05/23/2001 Document Revised: 11/20/2011 Document Reviewed: 08/30/2008 ExitCare Patient Information 2015 [employer]. This information is not intended to replace advice given to you by your health care provider. Make sure you discuss any questions you have with your health care provider.  

## 2015-02-11 ENCOUNTER — Telehealth: Payer: Self-pay | Admitting: Internal Medicine

## 2015-02-11 NOTE — Telephone Encounter (Signed)
Pt insurance company changed and is requesting letter on physicians letterhead stating and age-appropriate physical with lab work has been performed. Please call pt on cell phone when complete.

## 2015-02-11 NOTE — Telephone Encounter (Signed)
It has been over a year so I suspect that won't work. I can write the letter but his exam was May 2015. Set up appt if he needs it

## 2015-02-12 ENCOUNTER — Encounter: Payer: Self-pay | Admitting: Internal Medicine

## 2015-02-12 NOTE — Telephone Encounter (Signed)
I left a message on patient's voice mail letter is ready for pick up and there's no charge.

## 2015-02-12 NOTE — Telephone Encounter (Signed)
Harold Li told patient he was due for a physical, but patient said they just need a letter for last year.

## 2015-02-12 NOTE — Telephone Encounter (Signed)
Done No charge 

## 2015-12-06 ENCOUNTER — Encounter: Payer: Self-pay | Admitting: Family Medicine

## 2015-12-06 ENCOUNTER — Ambulatory Visit (INDEPENDENT_AMBULATORY_CARE_PROVIDER_SITE_OTHER): Payer: 59 | Admitting: Family Medicine

## 2015-12-06 VITALS — BP 122/78 | HR 82 | Temp 98.4°F | Ht 68.0 in | Wt 195.4 lb

## 2015-12-06 DIAGNOSIS — B349 Viral infection, unspecified: Secondary | ICD-10-CM | POA: Diagnosis not present

## 2015-12-06 LAB — POCT INFLUENZA A/B
Influenza A, POC: NEGATIVE
Influenza B, POC: NEGATIVE

## 2015-12-06 NOTE — Progress Notes (Signed)
Pre visit review using our clinic review tool, if applicable. No additional management support is needed unless otherwise documented below in the visit note. 

## 2015-12-06 NOTE — Assessment & Plan Note (Signed)
New acute problem.  Rapid flu negative today. I doubt the patient has influenza given lack of true fever. However, there are false negatives. I advised him to contact us if he runs a fever throughout the day and we will treat him empirically for flu. He's had no known exposures to the flu. His wife is currently sick.  This is likely viral illness. Advised supportive care - tylenol/ibuprofen, rest, fluids.

## 2015-12-06 NOTE — Patient Instructions (Signed)
Your flu test was negative today.  This is likely just a viral illness.   Continue supportive care - Tylenol/Ibuprofen, rest, fluids.  If you continue to have fever please let us know.  Take care  Dr. Lacinda Axon

## 2015-12-06 NOTE — Progress Notes (Signed)
Subjective:  Patient ID: Harold Li, male    DOB: 11-25-1960  Age: 55 y.o. MRN: [id number]  CC: Cough, Body Aches, Low grade fever  HPI:  55 year old male presents to clinic today with the above complaints.  Patient states that he has not felt well since yesterday. He's been experiencing nonproductive cough, bodyaches, low-grade fever (100.1 this am). He took Tylenol this morning with some improvement. He has a known sick contacts as his wife has been sick recently. He did not receive a flu shot this year. No known exacerbating factors. No other complaints at this time.  Social Hx   Social History   Social History  . Marital Status: Married    Spouse Name: N/A  . Number of Children: N/A  . Years of Education: N/A   Occupational History  . 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 Smoking status: Former Smoker    Types: Cigarettes    Quit date: 08/11/2009  . Smokeless tobacco: Never Used  . Alcohol Use: Yes     Comment: extrememly rare about twice yearly  . Drug Use: No  . Sexual Activity: Not Asked   Other Topics Concern  . None   Social History Narrative   Married 1/15   Review of Systems  Respiratory: Positive for cough.   Musculoskeletal:       Body aches.   Objective:  BP 122/78 mmHg  Pulse 82  Temp(Src) 98.4 F (36.9 C) (Oral)  Ht 5\' 8"  (1.727 m)  Wt 195 lb 6 oz (88.622 kg)  BMI 29.71 kg/m2  SpO2 96%  BP/Weight 12/06/2015 07/07/2014 [id number]  Systolic BP [id number]  Diastolic BP 78 72 83  Wt. (Lbs) 195.38 196 186  BMI 29.71 29.81 28.29   Physical Exam  Constitutional: He is oriented to person, place, and time. He appears well-developed. No distress.  HENT:  Mouth/Throat: Oropharynx is clear and moist.  R TM with slight effusion. Normal Left TM.  Eyes: Conjunctivae are normal. No scleral icterus.  Neck: Neck supple.  Cardiovascular: Normal rate and regular rhythm.   No murmur heard. Pulmonary/Chest: Effort normal and  breath sounds normal. He has no wheezes. He has no rales.  Lymphadenopathy:    He has no cervical adenopathy.  Neurological: He is alert and oriented to person, place, and time.  Vitals reviewed.  Lab Results  Component Value Date   WBC 6.5 01/19/2014   HGB 15.4 01/19/2014   HCT 45.3 01/19/2014   PLT 232.0 01/19/2014   GLUCOSE 86 01/19/2014   CHOL 196 01/19/2014   TRIG 58.0 01/19/2014   HDL 57.70 01/19/2014   LDLDIRECT 164.5 06/02/2011   LDLCALC 127* 01/19/2014   ALT 53 01/19/2014   AST 38* 01/19/2014   NA 139 01/19/2014   K 4.6 01/19/2014   CL 103 01/19/2014   CREATININE 1.1 01/19/2014   BUN 19 01/19/2014   CO2 29 01/19/2014   TSH 1.42 01/19/2014   PSA 1.00 01/19/2014    Assessment & Plan:   Problem List Items Addressed This Visit    Viral illness - Primary    New acute problem.  Rapid flu negative today. I doubt the patient has influenza given lack of true fever. However, there are false negatives. I advised him to contact us if he runs a fever throughout the day and we will treat him empirically for flu. He's had no known exposures to the flu. His wife is currently  sick.  This is likely viral illness. Advised supportive care - tylenol/ibuprofen, rest, fluids.      Relevant Orders   POCT Influenza A/B (Completed)     Follow-up: PRN  [place]

## 2016-04-25 ENCOUNTER — Ambulatory Visit (INDEPENDENT_AMBULATORY_CARE_PROVIDER_SITE_OTHER): Payer: 59 | Admitting: Internal Medicine

## 2016-04-25 ENCOUNTER — Encounter: Payer: Self-pay | Admitting: Internal Medicine

## 2016-04-25 VITALS — BP 120/78 | HR 66 | Temp 98.4°F | Ht 68.0 in | Wt 191.0 lb

## 2016-04-25 DIAGNOSIS — Z23 Encounter for immunization: Secondary | ICD-10-CM

## 2016-04-25 DIAGNOSIS — Z125 Encounter for screening for malignant neoplasm of prostate: Secondary | ICD-10-CM | POA: Diagnosis not present

## 2016-04-25 DIAGNOSIS — Z0000 Encounter for general adult medical examination without abnormal findings: Secondary | ICD-10-CM

## 2016-04-25 DIAGNOSIS — E785 Hyperlipidemia, unspecified: Secondary | ICD-10-CM

## 2016-04-25 DIAGNOSIS — Z1159 Encounter for screening for other viral diseases: Secondary | ICD-10-CM

## 2016-04-25 LAB — COMPREHENSIVE METABOLIC PANEL
ALBUMIN: 4.4 g/dL (ref 3.5–5.2)
ALK PHOS: 65 U/L (ref 39–117)
ALT: 27 U/L (ref 0–53)
AST: 25 U/L (ref 0–37)
BUN: 17 mg/dL (ref 6–23)
CO2: 28 mEq/L (ref 19–32)
CREATININE: 1.1 mg/dL (ref 0.40–1.50)
Calcium: 9.5 mg/dL (ref 8.4–10.5)
Chloride: 105 mEq/L (ref 96–112)
GFR: 73.27 mL/min (ref >60.00)
Glucose, Bld: 92 mg/dL (ref 70–99)
Potassium: 4.6 mEq/L (ref 3.5–5.1)
SODIUM: 140 meq/L (ref 135–145)
TOTAL PROTEIN: 6.7 g/dL (ref 6.0–8.3)
Total Bilirubin: 0.6 mg/dL (ref 0.2–1.2)

## 2016-04-25 LAB — PSA: PSA: 0.59 ng/mL (ref 0.10–4.00)

## 2016-04-25 LAB — CBC WITH DIFFERENTIAL/PLATELET
Basophils Absolute: 0 10*3/uL (ref 0.0–0.1)
Basophils Relative: 0.3 % (ref 0.0–3.0)
EOS ABS: 0.1 10*3/uL (ref 0.0–0.7)
Eosinophils Relative: 1.5 % (ref 0.0–5.0)
HCT: 44.8 % (ref 39.0–52.0)
HEMOGLOBIN: 15.1 g/dL (ref 13.0–17.0)
Lymphocytes Relative: 38 % (ref 12.0–46.0)
Lymphs Abs: 1.9 10*3/uL (ref 0.7–4.0)
MCHC: 33.7 g/dL (ref 30.0–36.0)
MCV: 89.9 fl (ref 78.0–100.0)
MONO ABS: 0.4 10*3/uL (ref 0.1–1.0)
Monocytes Relative: 8.1 % (ref 3.0–12.0)
Neutro Abs: 2.6 10*3/uL (ref 1.4–7.7)
Neutrophils Relative %: 52.1 % (ref 43.0–77.0)
Platelets: 231 10*3/uL (ref 150.0–400.0)
RBC: 4.98 Mil/uL (ref 4.22–5.81)
RDW: 13.4 % (ref 11.5–15.5)
WBC: 5 10*3/uL (ref 4.0–10.5)

## 2016-04-25 LAB — T4, FREE: FREE T4: 0.86 ng/dL (ref 0.60–1.60)

## 2016-04-25 LAB — LIPID PANEL
CHOL/HDL RATIO: 3
Cholesterol: 193 mg/dL (ref 0–200)
HDL: 56 mg/dL (ref >39.00)
LDL Cholesterol: 123 mg/dL — ABNORMAL HIGH (ref 0–99)
NONHDL: 137.44
TRIGLYCERIDES: 70 mg/dL (ref 0.0–149.0)
VLDL: 14 mg/dL (ref 0.0–40.0)

## 2016-04-25 NOTE — Addendum Note (Signed)
Addended by: Emelia Salisbury C on: 04/25/2016 11:08 AM   Modules accepted: Orders

## 2016-04-25 NOTE — Progress Notes (Signed)
Pre visit review using our clinic review tool, if applicable. No additional management support is needed unless otherwise documented below in the visit note. 

## 2016-04-25 NOTE — Assessment & Plan Note (Signed)
Was better last time with weight loss Will check again today

## 2016-04-25 NOTE — Assessment & Plan Note (Signed)
Healthy Working on fitness Colon due 2020 Will check PSA Tdap today

## 2016-04-25 NOTE — Progress Notes (Signed)
Subjective:    Patient ID: Harold Li, male    DOB: 02-Feb-1961, 55 y.o.   MRN: [id number]  HPI Here for physical  Diagnosed with carpal tunnel on right Has brace and on meloxicam Still having some finger numbness Has follow up next week--Murphy Wainer  Mild ankle problem on left  Seems to be improving Grabs on lateral side at times Doing better since changed from boots to shoes  No current outpatient prescriptions on file prior to visit.   No current facility-administered medications on file prior to visit.     Allergies  Allergen Reactions  . Citalopram Hydrobromide     REACTION: no energy, weak    Past Medical History:  Diagnosis Date  . Depression   . History of nephrolithiasis    Dr. Rosana Hoes  . Hyperlipidemia     Past Surgical History:  Procedure Laterality Date  . 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 Coronary artery disease Father   . Arthritis Father   . Stroke Father     in eye  . Diabetes Mother   . Arthritis Mother   . Colon cancer Neg Hx   . Pancreatic cancer Neg Hx   . Rectal cancer Neg Hx   . Stomach cancer Neg Hx     Social History   Social History  . Marital status: Married    Spouse name: N/A  . Number of children: N/A  . Years of education: N/A   Occupational History  .  [employer], occupational supply   Social History Main Topics  . Smoking status: Former Smoker    Types: Cigarettes    Quit date: 08/11/2009  . Smokeless tobacco: Never Used  . Alcohol use Yes     Comment: extrememly rare about twice yearly  . Drug use: No  . Sexual activity: Not on file   Other Topics Concern  . Not on file   Social History Narrative   Married 1/15   Review of Systems  Constitutional: Negative for fatigue and unexpected weight change.       Has kept weight down Back to working out at gym Wears seat belt  HENT: Negative for dental problem,  hearing loss and tinnitus.        Wears ear protection daily at work Keeps up with dentist  Eyes: Negative for visual disturbance.       No diplopia or unilateral vision loss  Respiratory: Negative for cough, chest tightness and shortness of breath.   Cardiovascular: Negative for chest pain and leg swelling.       Occ palpitations at rest  Gastrointestinal: Negative for abdominal pain, blood in stool, constipation, nausea and vomiting.       No heartburn   Endocrine: Negative for polydipsia and polyuria.  Genitourinary: Negative for difficulty urinating, frequency and urgency.       No sexual problems  Musculoskeletal: Negative for back pain and joint swelling.  Skin: Negative for rash.       No suspicious lesions--goes to derm at least once a year  Allergic/Immunologic: Positive for environmental allergies. Negative for immunocompromised state.       Occ OTC meds  Neurological: Negative for dizziness, syncope, weakness, light-headedness and headaches.  Hematological: Negative for adenopathy. Bruises/bleeds easily.  Psychiatric/Behavioral: Negative for dysphoric mood and sleep disturbance. The patient is not nervous/anxious.  Objective:   Physical Exam  Constitutional: He is oriented to person, place, and time. He appears well-developed and well-nourished. No distress.  HENT:  Head: Normocephalic and atraumatic.  Right Ear: External ear normal.  Left Ear: External ear normal.  Mouth/Throat: Oropharynx is clear and moist. No oropharyngeal exudate.  Eyes: Conjunctivae are normal. Pupils are equal, round, and reactive to light.  Neck: Normal range of motion. Neck supple. No thyromegaly present.  Cardiovascular: Normal rate, regular rhythm, normal heart sounds and intact distal pulses.  Exam reveals no gallop.   No murmur heard. Pulmonary/Chest: Effort normal and breath sounds normal. No respiratory distress. He has no wheezes. He has no rales.  Abdominal: Soft. There is no  tenderness.  Musculoskeletal: He exhibits no edema or tenderness.  Lymphadenopathy:    He has no cervical adenopathy.  Neurological: He is alert and oriented to person, place, and time.  Skin: No rash noted. No erythema.  Psychiatric: He has a normal mood and affect. His behavior is normal.          Assessment & Plan:

## 2016-04-26 LAB — HEPATITIS C ANTIBODY: HCV Ab: REACTIVE — AB

## 2016-05-01 ENCOUNTER — Other Ambulatory Visit: Payer: Self-pay | Admitting: Internal Medicine

## 2016-05-01 ENCOUNTER — Telehealth: Payer: Self-pay | Admitting: *Deleted

## 2016-05-01 DIAGNOSIS — R768 Other specified abnormal immunological findings in serum: Secondary | ICD-10-CM

## 2016-05-01 LAB — HEPATITIS C RNA QUANTITATIVE

## 2016-05-01 NOTE — Telephone Encounter (Signed)
I sent new orders to [facility]--- already notified me Has he been set up for redraw?

## 2016-05-01 NOTE — Telephone Encounter (Signed)
Harold Li with [name] called: Quantity of blood not sufficient for HCV Quantitative drawn last week. Will need re-redraw.

## 2016-05-02 NOTE — Telephone Encounter (Signed)
Pt scheduled for 8.23.17,

## 2016-05-03 ENCOUNTER — Other Ambulatory Visit: Payer: 59

## 2016-05-03 NOTE — Addendum Note (Signed)
Addended by: Ellamae Sia on: 05/03/2016 04:20 PM   Modules accepted: Orders

## 2016-05-08 ENCOUNTER — Other Ambulatory Visit: Payer: Self-pay | Admitting: Internal Medicine

## 2016-05-08 ENCOUNTER — Other Ambulatory Visit (INDEPENDENT_AMBULATORY_CARE_PROVIDER_SITE_OTHER): Payer: 59

## 2016-05-08 DIAGNOSIS — R768 Other specified abnormal immunological findings in serum: Secondary | ICD-10-CM

## 2016-05-08 DIAGNOSIS — R894 Abnormal immunological findings in specimens from other organs, systems and tissues: Secondary | ICD-10-CM

## 2016-05-09 LAB — HEPATITIS C ANTIBODY: HCV AB: REACTIVE — AB

## 2016-05-10 LAB — LIVER FIBROSIS, FIBROTEST-ACTITEST
ALT: 25 U/L (ref 9–46)
APOLIPOPROTEIN A1: 151 mg/dL (ref 94–176)
Alpha-2-Macroglobulin: 141 mg/dL (ref 106–279)
Bilirubin: 0.5 mg/dL (ref 0.2–1.2)
Fibrosis Score: 0.2
GGT: 13 U/L (ref 3–85)
HAPTOGLOBIN: 34 mg/dL — AB (ref 43–212)
Necroinflammat ACT Score: 0.1
Reference ID: 1616154

## 2016-05-10 LAB — HEPATITIS C RNA QUANTITATIVE: HCV Quantitative: <15 IU/mL (ref <15)

## 2016-09-21 DIAGNOSIS — L570 Actinic keratosis: Secondary | ICD-10-CM | POA: Diagnosis not present

## 2016-10-13 ENCOUNTER — Encounter: Payer: Self-pay | Admitting: Family Medicine

## 2016-10-13 ENCOUNTER — Ambulatory Visit (INDEPENDENT_AMBULATORY_CARE_PROVIDER_SITE_OTHER): Payer: 59 | Admitting: Family Medicine

## 2016-10-13 VITALS — BP 130/74 | HR 73 | Temp 98.3°F | Resp 16 | Ht 68.0 in | Wt 200.5 lb

## 2016-10-13 DIAGNOSIS — R350 Frequency of micturition: Secondary | ICD-10-CM

## 2016-10-13 DIAGNOSIS — N410 Acute prostatitis: Secondary | ICD-10-CM

## 2016-10-13 DIAGNOSIS — Z87442 Personal history of urinary calculi: Secondary | ICD-10-CM

## 2016-10-13 LAB — POCT URINALYSIS DIPSTICK
Bilirubin, UA: NEGATIVE
Glucose, UA: NEGATIVE
KETONES UA: NEGATIVE
Leukocytes, UA: NEGATIVE
Nitrite, UA: NEGATIVE
Protein, UA: NEGATIVE
UROBILINOGEN UA: NEGATIVE
pH, UA: 6

## 2016-10-13 MED ORDER — SULFAMETHOXAZOLE-TRIMETHOPRIM 800-160 MG PO TABS: 1.0000 | ORAL_TABLET | Freq: Two times a day (BID) | ORAL | 0 refills | Status: DC

## 2016-10-13 NOTE — Progress Notes (Signed)
BP 130/74   Pulse 73   Temp 98.3 F (36.8 C) (Oral)   Resp 16   Ht 5\' 8"  (1.727 m)   Wt 200 lb 8 oz (90.9 kg)   SpO2 97%   BMI 30.49 kg/m    CC: urinary frequency Subjective:    Patient ID: Harold Li, male    DOB: 07-Aug-1961, 56 y.o.   MRN: [id number]  HPI: Harold Li is a 56 y.o. male presenting on 10/13/2016 for Urinary Frequency (1 month, discomfort, unable to provide sufficient amount, kidney stone in the past )   1 mo h/o persistent dysuria, discomfort, incomplete emptying, increased nocturia. Initially intermittent symptoms, now more persistent. Some bladder discomfort.   Hasn't tried anything for this, did take tylenol last night.   No fevers/chills, nausea/vomiting, abd pain or flank pain, rectal pain. No hematuria.   H/o kidney stones, this doesn't feel similar.  H/o prostatitis years ago.   H/o partial nephrectomy for oncocytoma 2011. Hasn't seen Dr Alinda Money in 5 yrs.   Relevant past medical, surgical, family and social history reviewed and updated as indicated. Interim medical history since our last visit reviewed. Allergies and medications reviewed and updated. No current outpatient prescriptions on file prior to visit.   No current facility-administered medications on file prior to visit.     Review of Systems Per HPI unless specifically indicated in ROS section     Objective:    BP 130/74   Pulse 73   Temp 98.3 F (36.8 C) (Oral)   Resp 16   Ht 5\' 8"  (1.727 m)   Wt 200 lb 8 oz (90.9 kg)   SpO2 97%   BMI 30.49 kg/m   Wt Readings from Last 3 Encounters:  10/13/16 200 lb 8 oz (90.9 kg)  04/25/16 191 lb (86.6 kg)  12/06/15 195 lb 6 oz (88.6 kg)    Physical Exam  Constitutional: He appears well-developed and well-nourished. No distress.  Abdominal: Soft. Normal appearance and bowel sounds are normal. He exhibits no distension and no mass. There is no hepatosplenomegaly. There is tenderness (mild) in the periumbilical area and suprapubic area.  There is no rebound, no guarding and no CVA tenderness.  Genitourinary: Rectum normal. Rectal exam shows no external hemorrhoid, no internal hemorrhoid, no fissure, no mass, no tenderness and anal tone normal. Prostate is tender. Prostate is not enlarged (20gm).  Genitourinary Comments: Boggy tender prostate on exam, no asymmetry or induration or nodularity however  Psychiatric: He has a normal mood and affect.  Nursing note and vitals reviewed.  Results for orders placed or performed in visit on 10/13/16  POCT urinalysis dipstick  Result Value Ref Range   Color, UA Yellow    Clarity, UA Clear    Glucose, UA Negative    Bilirubin, UA Negative    Ketones, UA Negative    Spec Grav, UA >=1.030    Blood, UA 1+    pH, UA 6.0    Protein, UA Negative    Urobilinogen, UA negative    Nitrite, UA Negative    Leukocytes, UA Negative Negative      Assessment & Plan:   Problem List Items Addressed This Visit    Acute prostatitis with hematuria - Primary    New. Treat with 2wk bactrim course. UCx sent.  Update if sxs persist or fail to improve UA with 1+ blood but micro without abnormal amt RBC/hpf.  No f/u needed unless sxs persist. Pt agrees with plan.  Relevant Orders   Urine culture    Other Visit Diagnoses    Frequent urination       Relevant Orders   POCT urinalysis dipstick (Completed)       Follow up plan: Return if symptoms worsen or fail to improve.  Ria Bush, MD

## 2016-10-13 NOTE — Patient Instructions (Addendum)
You have prostate infection Treat with antibiotic 2 wk course.  Push fluids and rest. Avoid bladder irritants.  Let us know if symptoms don't fully resolve with treatment.   Prostatitis Prostatitis is swelling or inflammation of the prostate gland. The prostate is a walnut-sized gland that is involved in the production of semen. It is located below a man's bladder, in front of the rectum. There are four types of prostatitis:  Chronic nonbacterial prostatitis. This is the most common type of prostatitis. It may be associated with a viral infection or autoimmune disorder.  Acute bacterial prostatitis. This is the least common type of prostatitis. It starts quickly and is usually associated with a bladder infection, high fever, and shaking chills. It can occur at any age.  Chronic bacterial prostatitis. This type usually results from acute bacterial prostatitis that happens repeatedly (is recurrent) or has not been treated properly. It can occur in men of any age but is most common among middle-aged men whose prostate has begun to get larger. The symptoms are not as severe as symptoms caused by acute bacterial prostatitis.  Prostatodynia or chronic pelvic pain syndrome (CPPS). This type is also called pelvic floor disorder. It is associated with increased muscular tone in the pelvis surrounding the prostate. What are the causes? Bacterial prostatitis is caused by infection from bacteria. Chronic nonbacterial prostatitis may be caused by:  Urinary tract infections (UTIs).  Nerve damage.  A response by the body's disease-fighting system (autoimmune response).  Chemicals in the urine. The causes of the other types of prostatitis are usually not known. What are the signs or symptoms? Symptoms of this condition vary depending upon the type of prostatitis. If you have acute bacterial prostatitis, you may experience:  Urinary symptoms, such as:  Painful urination.  Burning during  urination.  Frequent and sudden urges to urinate.  Inability to start urinating.  A weak or interrupted stream of urine.  Vomiting.  Nausea.  Fever.  Chills.  Inability to empty the bladder completely.  Pain in the:  Muscles or joints.  Lower back.  Lower abdomen. If you have any of the other types of prostatitis, you may experience:  Urinary symptoms, such as:  Sudden urges to urinate.  Frequent urination.  Difficulty starting urination.  Weak urine stream.  Dribbling after urination.  Discharge from the urethra. The urethra is a tube that opens at the end of the penis.  Pain in the:  Testicles.  Penis or tip of the penis.  Rectum.  Area in front of the rectum and below the scrotum (perineum).  Problems with sexual function.  Painful ejaculation.  Bloody semen. How is this diagnosed? This condition may be diagnosed based on:  A physical and medical exam.  Your symptoms.  A urine test to check for bacteria.  An exam in which a health care provider uses a finger to feel the prostate (digital rectal exam).  A test of a sample of semen.  Blood tests.  Ultrasound.  Removal of prostate tissue to be examined under a microscope (biopsy).  Tests to check how your body handles urine (urodynamic tests).  A test to look inside your bladder or urethra (cystoscopy). How is this treated? Treatment for this condition depends on the type of prostatitis. Treatment may involve:  Medicines to relieve pain or inflammation.  Medicines to help relax your muscles.  Physical therapy.  Heat therapy.  Techniques to help you control certain body functions (biofeedback).  Relaxation exercises.  Antibiotic medicine,  if your condition is caused by bacteria.  Warm water baths (sitz baths). Sitz baths help with relaxing your pelvic floor muscles, which helps to relieve pressure on the prostate. Follow these instructions at home:  Take over-the-counter  and prescription medicines only as told by your health care provider.  If you were prescribed an antibiotic, take it as told by your health care provider. Do not stop taking the antibiotic even if you start to feel better.  If physical therapy, biofeedback, or relaxation exercises were prescribed, do exercises as instructed.  Take sitz baths as directed by your health care provider. For a sitz bath, sit in warm water that is deep enough to cover your hips and buttocks.  Keep all follow-up visits as told by your health care provider. This is important. Contact a health care provider if:  Your symptoms get worse.  You have a fever. Get help right away if:  You have chills.  You feel nauseous.  You vomit.  You feel light-headed or feel like you are going to faint.  You are unable to urinate.  You have blood or blood clots in your urine. This information is not intended to replace advice given to you by your health care provider. Make sure you discuss any questions you have with your health care provider. Document Released: 08/25/2000 Document Revised: 05/18/2016 Document Reviewed: 05/18/2016 Elsevier Interactive Patient Education  2017 [employer].

## 2016-10-13 NOTE — Assessment & Plan Note (Addendum)
New. Treat with 2wk bactrim course. UCx sent.  Update if sxs persist or fail to improve UA with 1+ blood but micro without abnormal amt RBC/hpf.  No f/u needed unless sxs persist. Pt agrees with plan.

## 2016-10-13 NOTE — Progress Notes (Signed)
Pre-visit discussion using our clinic review tool. No additional management support is needed unless otherwise documented below in the visit note.  

## 2016-10-15 LAB — URINE CULTURE: ORGANISM ID, BACTERIA: NO GROWTH

## 2016-10-31 ENCOUNTER — Encounter: Payer: Self-pay | Admitting: Internal Medicine

## 2016-10-31 ENCOUNTER — Ambulatory Visit (INDEPENDENT_AMBULATORY_CARE_PROVIDER_SITE_OTHER): Payer: 59 | Admitting: Internal Medicine

## 2016-10-31 VITALS — BP 140/88 | HR 64 | Temp 97.5°F | Wt 203.0 lb

## 2016-10-31 DIAGNOSIS — N4282 Prostatosis syndrome: Secondary | ICD-10-CM | POA: Insufficient documentation

## 2016-10-31 LAB — POC URINALSYSI DIPSTICK (AUTOMATED)
BILIRUBIN UA: NEGATIVE
Glucose, UA: NEGATIVE
Ketones, UA: NEGATIVE
LEUKOCYTES UA: NEGATIVE
NITRITE UA: NEGATIVE
Protein, UA: NEGATIVE
Spec Grav, UA: 1.025
UROBILINOGEN UA: 0.2
pH, UA: 6

## 2016-10-31 MED ORDER — TAMSULOSIN HCL 0.4 MG PO CAPS: 0.4000 mg | ORAL_CAPSULE | Freq: Every day | ORAL | 3 refills | Status: DC

## 2016-10-31 MED ORDER — DOXYCYCLINE HYCLATE 100 MG PO TABS: 100.0000 mg | ORAL_TABLET | Freq: Two times a day (BID) | ORAL | 0 refills | Status: DC

## 2016-10-31 NOTE — Addendum Note (Signed)
Addended by: Pilar Grammes on: 10/31/2016 08:54 AM   Modules accepted: Orders

## 2016-10-31 NOTE — Assessment & Plan Note (Addendum)
Only fleeting, mild improvement with bactrim---that didn't last Urinalysis negative for nitrite/leuk. Slight blood Will give doxy for 3 weeks Add tamsulosin--for at least a month

## 2016-10-31 NOTE — Progress Notes (Signed)
   Subjective:    Patient ID: Harold Li, male    DOB: [birth year], 58 y.o.   MRN: [id number]  HPI Here due to recurrent urinary symptoms  Has had some symptoms for 2-3 weeks before the last visit Mild discomfort which then worsened More dysuria and urgency (like in middle of night)--recurrent Intermittent  Started again yesterday---dull suprapubic pain that radiates through penis Will come on--but then eases up (after 30-45 minutes---during which he needs to go to bathroom repetitively)  The antibiotics did seem to decrease the symptoms Finished 3 days and symptoms right back  No hematuria No fever No recent sex  Current Outpatient Prescriptions on File Prior to Visit  Medication Sig Dispense Refill  . Omega-3 Fatty Acids (FISH OIL PO) Take by mouth.     No current facility-administered medications on file prior to visit.     Allergies  Allergen Reactions  . Citalopram Hydrobromide     REACTION: no energy, weak    Past Medical History:  Diagnosis Date  . Depression   . History of nephrolithiasis    Dr. Rosana Hoes  . Hyperlipidemia     Past Surgical History:  Procedure Laterality Date  . 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 Coronary artery disease Father   . Arthritis Father   . Stroke Father     in eye  . Diabetes Mother   . Arthritis Mother   . Colon cancer Neg Hx   . Pancreatic cancer Neg Hx   . Rectal cancer Neg Hx   . Stomach cancer Neg Hx     Social History   Social History  . Marital status: Married    Spouse name: N/A  . Number of children: N/A  . Years of education: N/A   Occupational History  .  [employer], occupational supply   Social History Main Topics  . Smoking status: Former Smoker    Types: Cigarettes    Quit date: 08/11/2009  . Smokeless tobacco: Never Used  . Alcohol use Yes     Comment: extrememly rare about twice yearly  . Drug use:  No  . Sexual activity: Not on file   Other Topics Concern  . Not on file   Social History Narrative   Married 1/15   Review of Systems  No nausea or vomiting  Appetite is good Bowels regular    Objective:   Physical Exam  Constitutional: He appears well-nourished. No distress.  Abdominal: Soft. He exhibits no distension. There is no rebound and no guarding.  Mild suprapubic tenderness  Genitourinary:  Genitourinary Comments: Rectal deferred---just done at last visit          Assessment & Plan:

## 2016-10-31 NOTE — Patient Instructions (Signed)
Please start the new antibiotic and take it for the 3 weeks. Start the tamsulosin--you should take it for at least a month---but it may be better to continue for 2-3 months depending on how you are voiding.

## 2016-10-31 NOTE — Progress Notes (Signed)
Pre visit review using our clinic review tool, if applicable. No additional management support is needed unless otherwise documented below in the visit note. 

## 2016-12-01 ENCOUNTER — Encounter: Payer: Self-pay | Admitting: Internal Medicine

## 2016-12-01 ENCOUNTER — Ambulatory Visit (INDEPENDENT_AMBULATORY_CARE_PROVIDER_SITE_OTHER): Payer: 59 | Admitting: Internal Medicine

## 2016-12-01 VITALS — BP 132/78 | HR 75 | Temp 98.3°F | Wt 196.0 lb

## 2016-12-01 DIAGNOSIS — N4282 Prostatosis syndrome: Secondary | ICD-10-CM

## 2016-12-01 MED ORDER — CIPROFLOXACIN HCL 500 MG PO TABS: 500.0000 mg | ORAL_TABLET | Freq: Two times a day (BID) | ORAL | 0 refills | Status: DC

## 2016-12-01 NOTE — Progress Notes (Signed)
   Subjective:    Patient ID: Harold Li, male    DOB: 1960-10-16, 56 y.o.   MRN: [id number]  HPI Visit due to ongoing prostate problems   When he took the antibiotic this time--only helped a little, never went away Finished them about 10 days ago---symptoms not gone Pain came back hard yesterday  Stopped the tamsulosin--wasn't clear it was helping (since the symptoms persisted) May have been a little better without it  No fever Some burning dysuria No hematuria  Current Outpatient Prescriptions on File Prior to Visit  Medication Sig Dispense Refill  . Omega-3 Fatty Acids (FISH OIL PO) Take by mouth.     No current facility-administered medications on file prior to visit.     Allergies  Allergen Reactions  . Citalopram Hydrobromide     REACTION: no energy, weak    Past Medical History:  Diagnosis Date  . Depression   . History of nephrolithiasis    Dr. Rosana Hoes  . Hyperlipidemia     Past Surgical History:  Procedure Laterality Date  . 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 Coronary artery disease Father   . Arthritis Father   . Stroke Father     in eye  . Diabetes Mother   . Arthritis Mother   . Colon cancer Neg Hx   . Pancreatic cancer Neg Hx   . Rectal cancer Neg Hx   . Stomach cancer Neg Hx     Social History   Social History  . Marital status: Married    Spouse name: N/A  . Number of children: N/A  . Years of education: N/A   Occupational History  .  [employer], occupational supply   Social History Main Topics  . Smoking status: Former Smoker    Types: Cigarettes    Quit date: 08/11/2009  . Smokeless tobacco: Never Used  . Alcohol use Yes     Comment: extrememly rare about twice yearly  . Drug use: No  . Sexual activity: Not on file   Other Topics Concern  . Not on file   Social History Narrative   Married 1/15   Review of Systems No  N/V Some blood in toilet and toilet paper after initial constipation 2 days ago Appetite is okay    Objective:   Physical Exam  Abdominal:  Mild suprapubic tenderness  Genitourinary:  Genitourinary Comments: Prostate normal size with retained median sulcus. Not boggy but is tender. No masses Left (apparent) varicocele which is mildly tender (or the left testis)          Assessment & Plan:

## 2016-12-01 NOTE — Assessment & Plan Note (Signed)
Now becoming more chronic May need longer course of antibiotics--- will try cipro at fairly high dose Restart the tamsulosin Now will set up with urologist (esp with the varicocele) PSA reassuringly low last year

## 2016-12-01 NOTE — Progress Notes (Signed)
Pre visit review using our clinic review tool, if applicable. No additional management support is needed unless otherwise documented below in the visit note. 

## 2016-12-14 DIAGNOSIS — N4340 Spermatocele of epididymis, unspecified: Secondary | ICD-10-CM | POA: Diagnosis not present

## 2016-12-14 DIAGNOSIS — N410 Acute prostatitis: Secondary | ICD-10-CM | POA: Diagnosis not present

## 2016-12-14 DIAGNOSIS — N201 Calculus of ureter: Secondary | ICD-10-CM | POA: Diagnosis not present

## 2016-12-14 DIAGNOSIS — N1330 Unspecified hydronephrosis: Secondary | ICD-10-CM | POA: Diagnosis not present

## 2016-12-14 DIAGNOSIS — N200 Calculus of kidney: Secondary | ICD-10-CM | POA: Diagnosis not present

## 2017-01-02 DIAGNOSIS — L570 Actinic keratosis: Secondary | ICD-10-CM | POA: Diagnosis not present

## 2017-01-03 DIAGNOSIS — N200 Calculus of kidney: Secondary | ICD-10-CM | POA: Diagnosis not present

## 2017-04-27 ENCOUNTER — Encounter: Payer: 59 | Admitting: Internal Medicine

## 2017-05-04 ENCOUNTER — Ambulatory Visit (INDEPENDENT_AMBULATORY_CARE_PROVIDER_SITE_OTHER): Payer: 59 | Admitting: Internal Medicine

## 2017-05-04 ENCOUNTER — Encounter: Payer: Self-pay | Admitting: Internal Medicine

## 2017-05-04 VITALS — BP 114/74 | HR 62 | Temp 98.2°F | Ht 67.5 in | Wt 195.0 lb

## 2017-05-04 DIAGNOSIS — Z0000 Encounter for general adult medical examination without abnormal findings: Secondary | ICD-10-CM | POA: Diagnosis not present

## 2017-05-04 MED ORDER — MELOXICAM 15 MG PO TABS: 15.0000 mg | ORAL_TABLET | Freq: Every day | ORAL | 3 refills | Status: DC | PRN

## 2017-05-04 NOTE — Progress Notes (Signed)
Subjective:    Patient ID: Harold Li, male    DOB: Sep 01, 1961, 56 y.o.   MRN: [id number]  HPI Here for physical  Some concern about the hepatitis C result Positive antibody but very low titer (not quantifiable) Reassured  Recurrent right CTS symptoms Had meloxicam and used brace Back to the brace  Had kidney stone--not prostatitis Doing better now  Has red lesion on right upper arm for 1 month Seems to be getting smaller Suspicious to me---he is due for the dermatologist   Current Outpatient Prescriptions on File Prior to Visit  Medication Sig Dispense Refill  . Omega-3 Fatty Acids (FISH OIL PO) Take by mouth.     No current facility-administered medications on file prior to visit.     Allergies  Allergen Reactions  . Citalopram Hydrobromide     REACTION: no energy, weak    Past Medical History:  Diagnosis Date  . Depression   . History of nephrolithiasis    Dr. Rosana Hoes  . Hyperlipidemia     Past Surgical History:  Procedure Laterality Date  . 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 Coronary artery disease Father   . Arthritis Father   . Stroke Father        in eye  . Diabetes Mother   . Arthritis Mother   . Colon cancer Neg Hx   . Pancreatic cancer Neg Hx   . Rectal cancer Neg Hx   . Stomach cancer Neg Hx     Social History   Social History  . Marital status: Married    Spouse name: N/A  . Number of children: N/A  . Years of education: N/A   Occupational History  .  [employer]ologist, occupational supply   Social History Main Topics  . Smoking status: Former Smoker    Types: Cigarettes    Quit date: 08/11/2009  . Smokeless tobacco: Never Used  . Alcohol use Yes     Comment: extrememly rare about twice yearly  . Drug use: No  . Sexual activity: Not on file   Other Topics Concern  . Not on file   Social History Narrative   Married 1/15   Review of  Systems  Constitutional: Negative for fatigue and unexpected weight change.       Works out 3 days a week Wears seat belt  HENT: Negative for dental problem, hearing loss, tinnitus and trouble swallowing.        Keeps up with dentist  Eyes: Negative for visual disturbance.       No diplopia or unilateral vision loss  Respiratory: Negative for cough, chest tightness and shortness of breath.   Cardiovascular: Negative for chest pain and leg swelling.       Occ skipped beat when at rest  Gastrointestinal: Negative for abdominal pain, blood in stool, constipation and nausea.       Rare heartburn---uses OTC  Endocrine: Negative for polydipsia and polyuria.  Genitourinary: Negative for difficulty urinating and urgency.       No sexual problems  Musculoskeletal: Positive for back pain. Negative for arthralgias and joint swelling.  Skin: Negative for rash.  Allergic/Immunologic: Negative for environmental allergies and immunocompromised state.  Neurological: Negative for dizziness, syncope, light-headedness and headaches.  Hematological: Negative for adenopathy. Does not bruise/bleed easily.  Psychiatric/Behavioral: Negative for dysphoric mood and sleep disturbance. The patient is  not nervous/anxious.        Objective:   Physical Exam  Constitutional: He is oriented to person, place, and time. He appears well-developed and well-nourished. No distress.  HENT:  Head: Normocephalic and atraumatic.  Right Ear: External ear normal.  Left Ear: External ear normal.  Mouth/Throat: Oropharynx is clear and moist. No oropharyngeal exudate.  Eyes: Pupils are equal, round, and reactive to light. Conjunctivae are normal.  Neck: Normal range of motion. Neck supple. No thyromegaly present.  Cardiovascular: Normal rate, regular rhythm, normal heart sounds and intact distal pulses.  Exam reveals no gallop.   No murmur heard. Pulmonary/Chest: Effort normal and breath sounds normal. No respiratory distress.  He has no wheezes. He has no rales.  Abdominal: Soft. There is no tenderness.  Musculoskeletal: He exhibits no edema or tenderness.  Lymphadenopathy:    He has no cervical adenopathy.  Neurological: He is alert and oriented to person, place, and time.  Skin: No rash noted. No erythema.  7-21mm raised red lesion on proximal right arm  Psychiatric: He has a normal mood and affect. His behavior is normal.          Assessment & Plan:

## 2017-05-04 NOTE — Assessment & Plan Note (Signed)
Generally healthy Recommended flu vaccine--he will consider Defer PSA till at least next year Colon due 2020 Will hold off on blood work this year

## 2017-06-26 DIAGNOSIS — L814 Other melanin hyperpigmentation: Secondary | ICD-10-CM | POA: Diagnosis not present

## 2017-06-26 DIAGNOSIS — L821 Other seborrheic keratosis: Secondary | ICD-10-CM | POA: Diagnosis not present

## 2017-06-26 DIAGNOSIS — C44612 Basal cell carcinoma of skin of right upper limb, including shoulder: Secondary | ICD-10-CM | POA: Diagnosis not present

## 2017-08-29 ENCOUNTER — Other Ambulatory Visit: Payer: Self-pay | Admitting: Internal Medicine

## 2017-08-29 NOTE — Telephone Encounter (Signed)
Approved: okay to refill for a year 

## 2017-08-29 NOTE — Telephone Encounter (Signed)
Last Rx and OV 04/2017 #30 3R. pls advise

## 2017-12-25 DIAGNOSIS — L570 Actinic keratosis: Secondary | ICD-10-CM | POA: Diagnosis not present

## 2018-04-18 DIAGNOSIS — L03011 Cellulitis of right finger: Secondary | ICD-10-CM | POA: Diagnosis not present

## 2018-05-21 ENCOUNTER — Ambulatory Visit (INDEPENDENT_AMBULATORY_CARE_PROVIDER_SITE_OTHER): Payer: 59 | Admitting: Internal Medicine

## 2018-05-21 ENCOUNTER — Encounter: Payer: Self-pay | Admitting: Internal Medicine

## 2018-05-21 VITALS — BP 118/68 | HR 60 | Temp 98.2°F | Ht 67.5 in | Wt 184.0 lb

## 2018-05-21 DIAGNOSIS — Z0000 Encounter for general adult medical examination without abnormal findings: Secondary | ICD-10-CM | POA: Diagnosis not present

## 2018-05-21 DIAGNOSIS — R768 Other specified abnormal immunological findings in serum: Secondary | ICD-10-CM

## 2018-05-21 DIAGNOSIS — Z125 Encounter for screening for malignant neoplasm of prostate: Secondary | ICD-10-CM | POA: Diagnosis not present

## 2018-05-21 LAB — COMPREHENSIVE METABOLIC PANEL
ALBUMIN: 4.4 g/dL (ref 3.5–5.2)
ALT: 27 U/L (ref 0–53)
AST: 22 U/L (ref 0–37)
Alkaline Phosphatase: 59 U/L (ref 39–117)
BILIRUBIN TOTAL: 0.6 mg/dL (ref 0.2–1.2)
BUN: 14 mg/dL (ref 6–23)
CALCIUM: 9.7 mg/dL (ref 8.4–10.5)
CHLORIDE: 104 meq/L (ref 96–112)
CO2: 29 meq/L (ref 19–32)
CREATININE: 1.13 mg/dL (ref 0.40–1.50)
GFR: 70.52 mL/min (ref >60.00)
Glucose, Bld: 96 mg/dL (ref 70–99)
Potassium: 4.3 mEq/L (ref 3.5–5.1)
Sodium: 139 mEq/L (ref 135–145)
Total Protein: 6.9 g/dL (ref 6.0–8.3)

## 2018-05-21 LAB — CBC
HCT: 44.7 % (ref 39.0–52.0)
Hemoglobin: 15.3 g/dL (ref 13.0–17.0)
MCHC: 34.3 g/dL (ref 30.0–36.0)
MCV: 89.4 fl (ref 78.0–100.0)
PLATELETS: 227 10*3/uL (ref 150.0–400.0)
RBC: 5 Mil/uL (ref 4.22–5.81)
RDW: 13.3 % (ref 11.5–15.5)
WBC: 4.8 10*3/uL (ref 4.0–10.5)

## 2018-05-21 LAB — PSA: PSA: 0.49 ng/mL (ref 0.10–4.00)

## 2018-05-21 NOTE — Progress Notes (Signed)
Subjective:    Patient ID: Harold Li, male    DOB: Oct 30, 1960, 57 y.o.   MRN: [id number]  HPI Here for physical  Had infection in finger ~1 month ago Antibiotic from [facility] did resolve it No other issues  Otherwise doing fine Dad just admitted to hospital---may be having more coronary ischemia  Current Outpatient Medications on File Prior to Visit  Medication Sig Dispense Refill  . meloxicam (MOBIC) 15 MG tablet TAKE 1 TABLET (15 MG TOTAL) BY MOUTH DAILY AS NEEDED FOR PAIN. 90 tablet 3  . Omega-3 Fatty Acids (FISH OIL PO) Take by mouth.     No current facility-administered medications on file prior to visit.     Allergies  Allergen Reactions  . Citalopram Hydrobromide     REACTION: no energy, weak    Past Medical History:  Diagnosis Date  . Depression   . History of nephrolithiasis    Dr. Rosana Hoes  . Hyperlipidemia     Past Surgical History:  Procedure Laterality Date  . 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 Coronary artery disease Father   . Arthritis Father   . Stroke Father        in eye  . Diabetes Mother   . Arthritis Mother   . Colon cancer Neg Hx   . Pancreatic cancer Neg Hx   . Rectal cancer Neg Hx   . Stomach cancer Neg Hx     Social History   Socioeconomic History  . Marital status: Married    Spouse name: Not on file  . Number of children: Not on file  . Years of education: Not on file  . Highest education level: Not on file  Occupational History  . 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 Financial resource strain: Not on file  . Food insecurity:    Worry: Not on file    Inability: Not on file  . Transportation needs:    Medical: Not on file    Non-medical: Not on file  Tobacco Use  . Smoking status: Former Smoker    Types: Cigarettes    Last attempt to quit: 08/11/2009    Years since quitting: 8.7  .  Smokeless tobacco: Never Used  Substance and Sexual Activity  . Alcohol use: Yes    Comment: extrememly rare about twice yearly  . Drug use: No  . Sexual activity: Not on file  Lifestyle  . Physical activity:    Days per week: Not on file    Minutes per session: Not on file  . Stress: Not on file  Relationships  . Social connections:    Talks on phone: Not on file    Gets together: Not on file    Attends religious service: Not on file    Active member of club or organization: Not on file    Attends meetings of clubs or organizations: Not on file    Relationship status: Not on file  . Intimate partner violence:    Fear of current or ex partner: Not on file    Emotionally abused: Not on file    Physically abused: Not on file    Forced sexual activity: Not on file  Other Topics Concern  . Not on file  Social History Narrative   Married 1/15   Review of Systems  Constitutional: Negative  for fatigue.       Lost 10-15#-- Herbal Life shakes and exercise Wears seat belt  HENT: Negative for dental problem, hearing loss, tinnitus and trouble swallowing.        Recent bone graft in mouth  Eyes: Negative for visual disturbance.       No diplopia or unilateral vision loss  Respiratory: Negative for cough, chest tightness and shortness of breath.   Cardiovascular: Negative for chest pain and leg swelling.       Occ palpitation at rest  Gastrointestinal: Negative for blood in stool and constipation.       No heartburn  Endocrine: Negative for polydipsia and polyuria.  Genitourinary: Negative for difficulty urinating and urgency.       No sexual problems  Musculoskeletal: Positive for arthralgias. Negative for back pain and joint swelling.       Occ hand or ankle pain---rare meloxicam use  Skin: Negative for rash.       Does keep up with dermatologist twice a year  Allergic/Immunologic: Negative for environmental allergies and immunocompromised state.  Neurological: Negative for  dizziness, syncope, light-headedness and headaches.  Hematological: Negative for adenopathy. Does not bruise/bleed easily.  Psychiatric/Behavioral: Negative for dysphoric mood and sleep disturbance. The patient is not nervous/anxious.        Objective:   Physical Exam  Constitutional: He is oriented to person, place, and time. He appears well-developed. No distress.  HENT:  Head: Normocephalic and atraumatic.  Right Ear: External ear normal.  Left Ear: External ear normal.  Mouth/Throat: Oropharynx is clear and moist. No oropharyngeal exudate.  Eyes: Pupils are equal, round, and reactive to light. Conjunctivae are normal.  Neck: Normal range of motion. No thyromegaly present.  Cardiovascular: Normal rate, regular rhythm, normal heart sounds and intact distal pulses. Exam reveals no gallop.  No murmur heard. Respiratory: Effort normal and breath sounds normal. No respiratory distress. He has no wheezes. He has no rales.  GI: Soft. There is no tenderness.  Musculoskeletal: He exhibits no edema or tenderness.  Lymphadenopathy:    He has no cervical adenopathy.  Neurological: He is alert and oriented to person, place, and time.  Skin: No rash noted. No erythema.  Psychiatric: He has a normal mood and affect. His behavior is normal.           Assessment & Plan:

## 2018-05-21 NOTE — Assessment & Plan Note (Signed)
Healthy Got in better shape Prefers no flu vaccine Colon due next year Discussed PSA--will check

## 2018-05-21 NOTE — Assessment & Plan Note (Signed)
Will recheck labs fibrosure no fibrosis Virus detected but too low to quantitate Will recheck viral load

## 2018-05-23 LAB — HEPATITIS C RNA QUANTITATIVE
HCV QUANT LOG: NOT DETECTED {Log_IU}/mL
HCV RNA, PCR, QN: <15 IU/mL

## 2018-07-02 DIAGNOSIS — L570 Actinic keratosis: Secondary | ICD-10-CM | POA: Diagnosis not present

## 2018-07-10 DIAGNOSIS — Z23 Encounter for immunization: Secondary | ICD-10-CM | POA: Diagnosis not present

## 2018-08-28 ENCOUNTER — Encounter: Payer: Self-pay | Admitting: Primary Care

## 2018-08-28 ENCOUNTER — Other Ambulatory Visit: Payer: Self-pay | Admitting: Primary Care

## 2018-08-28 ENCOUNTER — Ambulatory Visit (INDEPENDENT_AMBULATORY_CARE_PROVIDER_SITE_OTHER): Payer: 59 | Admitting: Primary Care

## 2018-08-28 VITALS — BP 120/72 | HR 66 | Temp 98.2°F | Ht 67.75 in | Wt 190.2 lb

## 2018-08-28 DIAGNOSIS — R1033 Periumbilical pain: Secondary | ICD-10-CM

## 2018-08-28 DIAGNOSIS — N289 Disorder of kidney and ureter, unspecified: Secondary | ICD-10-CM

## 2018-08-28 LAB — CBC WITH DIFFERENTIAL/PLATELET
Basophils Absolute: 0 10*3/uL (ref 0.0–0.1)
Basophils Relative: 0.8 % (ref 0.0–3.0)
Eosinophils Absolute: 0.1 10*3/uL (ref 0.0–0.7)
Eosinophils Relative: 2.5 % (ref 0.0–5.0)
HCT: 44.5 % (ref 39.0–52.0)
HEMOGLOBIN: 15.2 g/dL (ref 13.0–17.0)
Lymphocytes Relative: 35.4 % (ref 12.0–46.0)
Lymphs Abs: 1.9 10*3/uL (ref 0.7–4.0)
MCHC: 34.1 g/dL (ref 30.0–36.0)
MCV: 90.7 fl (ref 78.0–100.0)
Monocytes Absolute: 0.6 10*3/uL (ref 0.1–1.0)
Monocytes Relative: 11.2 % (ref 3.0–12.0)
Neutro Abs: 2.7 10*3/uL (ref 1.4–7.7)
Neutrophils Relative %: 50.1 % (ref 43.0–77.0)
Platelets: 255 10*3/uL (ref 150.0–400.0)
RBC: 4.91 Mil/uL (ref 4.22–5.81)
RDW: 13.1 % (ref 11.5–15.5)
WBC: 5.5 10*3/uL (ref 4.0–10.5)

## 2018-08-28 LAB — COMPREHENSIVE METABOLIC PANEL
ALT: 21 U/L (ref 0–53)
AST: 20 U/L (ref 0–37)
Albumin: 4.1 g/dL (ref 3.5–5.2)
Alkaline Phosphatase: 66 U/L (ref 39–117)
BUN: 18 mg/dL (ref 6–23)
CALCIUM: 8.9 mg/dL (ref 8.4–10.5)
CHLORIDE: 106 meq/L (ref 96–112)
CO2: 28 meq/L (ref 19–32)
Creatinine, Ser: 1.35 mg/dL (ref 0.40–1.50)
GFR: 57.38 mL/min — AB (ref >60.00)
GLUCOSE: 74 mg/dL (ref 70–99)
POTASSIUM: 3.7 meq/L (ref 3.5–5.1)
Sodium: 141 mEq/L (ref 135–145)
Total Bilirubin: 0.3 mg/dL (ref 0.2–1.2)
Total Protein: 6.7 g/dL (ref 6.0–8.3)

## 2018-08-28 LAB — LIPASE: Lipase: 23 U/L (ref 11.0–59.0)

## 2018-08-28 NOTE — Patient Instructions (Signed)
Stop by the lab prior to leaving today. I will notify you of your results once received.   Stop by the front desk and speak with either Rosaria Ferries or Anastasiya regarding your ultrasound.   Avoid greasy, fatty, fried, spicy foods for now.   Please call me if you develop fevers, chills, nauea/vomiting.  It was a pleasure meeting you!

## 2018-08-28 NOTE — Progress Notes (Signed)
Subjective:    Patient ID: Harold Li, male    DOB: June 30, 1961, 57 y.o.   MRN: [id number]  HPI  Harold Li is a 57 year old male with a history of nephrolithiasis, partial nephrectomy, who presents today with a chief complaint of abdominal pain.  His pain is located to the right upper and periumbilical abdomen, mostly periumbilical, which began 6 days ago. His pain is intermittent but more noticeable after eating. His pain progressed several days later, now improved. His pain woke him from sleep four nights ago, had diarrhea at the time without much improvement in pain. His diarrhea began four days ago and is watery, having 3-4 episodes daily, 2 episodes today.   He took a laxative last night as he thought this would help, no improvement. He denies nausea, vomiting, fevers, bloody stools, hematuria, flank pain, decrease in appetite. This doesn't feel like his typical kidney stone symptoms.   Review of Systems  Constitutional: Negative for fever.  Gastrointestinal: Positive for abdominal pain and diarrhea. Negative for blood in stool, constipation, nausea and vomiting.  Genitourinary: Negative for dysuria, flank pain, hematuria and urgency.       Past Medical History:  Diagnosis Date  . Depression   . History of nephrolithiasis    Dr. Rosana Hoes  . Hyperlipidemia      Social History   Socioeconomic History  . Marital status: Married    Spouse name: Not on file  . Number of children: Not on file  . Years of education: Not on file  . Highest education level: Not on file  Occupational History  . 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 Financial resource strain: Not on file  . Food insecurity:    Worry: Not on file    Inability: Not on file  . Transportation needs:    Medical: Not on file    Non-medical: Not on file  Tobacco Use  . Smoking status: Former Smoker    Types: Cigarettes    Last attempt to quit: 08/11/2009      Years since quitting: 9.0  . Smokeless tobacco: Never Used  Substance and Sexual Activity  . Alcohol use: Yes    Comment: extrememly rare about twice yearly  . Drug use: No  . Sexual activity: Not on file  Lifestyle  . Physical activity:    Days per week: Not on file    Minutes per session: Not on file  . Stress: Not on file  Relationships  . Social connections:    Talks on phone: Not on file    Gets together: Not on file    Attends religious service: Not on file    Active member of club or organization: Not on file    Attends meetings of clubs or organizations: Not on file    Relationship status: Not on file  . Intimate partner violence:    Fear of current or ex partner: Not on file    Emotionally abused: Not on file    Physically abused: Not on file    Forced sexual activity: Not on file  Other Topics Concern  . Not on file  Social History Narrative   Married 1/15    Past Surgical History:  Procedure Laterality Date  . 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 Coronary artery disease Father   . Arthritis  Father   . Stroke Father        in eye  . Diabetes Mother   . Arthritis Mother   . Colon cancer Neg Hx   . Pancreatic cancer Neg Hx   . Rectal cancer Neg Hx   . Stomach cancer Neg Hx     Allergies  Allergen Reactions  . Citalopram Hydrobromide     REACTION: no energy, weak    Current Outpatient Medications on File Prior to Visit  Medication Sig Dispense Refill  . meloxicam (MOBIC) 15 MG tablet TAKE 1 TABLET (15 MG TOTAL) BY MOUTH DAILY AS NEEDED FOR PAIN. 90 tablet 3  . Omega-3 Fatty Acids (FISH OIL PO) Take by mouth.     No current facility-administered medications on file prior to visit.     BP 120/72   Pulse 66   Temp 98.2 F (36.8 C) (Oral)   Ht 5' 7.75" (1.721 m)   Wt 190 lb 4 oz (86.3 kg)   SpO2 96%   BMI 29.14 kg/m    Objective:   Physical Exam  Constitutional: He appears  well-nourished.  Cardiovascular: Normal rate.  Respiratory: Effort normal.  GI: Soft. Normal appearance and bowel sounds are normal. There is abdominal tenderness in the right upper quadrant and periumbilical area. There is no guarding, no CVA tenderness, no tenderness at McBurney's point and negative Murphy's sign.    Skin: Skin is warm and dry.           Assessment & Plan:

## 2018-08-28 NOTE — Assessment & Plan Note (Signed)
Also to RUQ with diarrhea, worse after meals. Differentials include cholelithiasis, cholecystitis, viral infection, less likely appendicitis and colitis given lack of fevers, nausea/vomiting. Colonoscopy reviewed from 2015, no evidence of diverticulitis. Start with CBC and CMP.  Abdominal ultrasound pending. Discussed bland diet. Return precautions provided. He is stable for outpatient treatment.

## 2018-08-29 ENCOUNTER — Ambulatory Visit
Admission: RE | Admit: 2018-08-29 | Discharge: 2018-08-29 | Disposition: A | Discharge status: Home / Self Care | Payer: 59 | Source: Ambulatory Visit | Attending: Primary Care | Admitting: Primary Care

## 2018-08-29 DIAGNOSIS — R1033 Periumbilical pain: Secondary | ICD-10-CM | POA: Diagnosis not present

## 2018-08-29 DIAGNOSIS — R1011 Right upper quadrant pain: Secondary | ICD-10-CM | POA: Diagnosis not present

## 2018-08-29 DIAGNOSIS — Z905 Acquired absence of kidney: Secondary | ICD-10-CM | POA: Insufficient documentation

## 2018-09-02 ENCOUNTER — Ambulatory Visit: Payer: 59

## 2018-09-05 ENCOUNTER — Other Ambulatory Visit (INDEPENDENT_AMBULATORY_CARE_PROVIDER_SITE_OTHER): Payer: 59

## 2018-09-05 DIAGNOSIS — N289 Disorder of kidney and ureter, unspecified: Secondary | ICD-10-CM

## 2018-09-05 LAB — BASIC METABOLIC PANEL
BUN: 15 mg/dL (ref 6–23)
CHLORIDE: 103 meq/L (ref 96–112)
CO2: 29 mEq/L (ref 19–32)
Calcium: 8.9 mg/dL (ref 8.4–10.5)
Creatinine, Ser: 1.15 mg/dL (ref 0.40–1.50)
GFR: 69.04 mL/min (ref >60.00)
Glucose, Bld: 105 mg/dL — ABNORMAL HIGH (ref 70–99)
Potassium: 4.1 mEq/L (ref 3.5–5.1)
Sodium: 138 mEq/L (ref 135–145)

## 2019-01-23 DIAGNOSIS — L0100 Impetigo, unspecified: Secondary | ICD-10-CM | POA: Diagnosis not present

## 2019-01-23 DIAGNOSIS — L570 Actinic keratosis: Secondary | ICD-10-CM | POA: Diagnosis not present

## 2019-03-27 ENCOUNTER — Other Ambulatory Visit: Payer: Self-pay | Admitting: Internal Medicine

## 2019-05-30 ENCOUNTER — Other Ambulatory Visit: Payer: Self-pay

## 2019-05-30 ENCOUNTER — Encounter: Payer: Self-pay | Admitting: Internal Medicine

## 2019-05-30 ENCOUNTER — Ambulatory Visit (INDEPENDENT_AMBULATORY_CARE_PROVIDER_SITE_OTHER): Payer: BC Managed Care – PPO | Admitting: Internal Medicine

## 2019-05-30 DIAGNOSIS — Z0000 Encounter for general adult medical examination without abnormal findings: Secondary | ICD-10-CM

## 2019-05-30 DIAGNOSIS — M67442 Ganglion, left hand: Secondary | ICD-10-CM | POA: Diagnosis not present

## 2019-05-30 NOTE — Assessment & Plan Note (Signed)
Healthy Due for colon Will defer PSA to next year Will get flu vaccine at work

## 2019-05-30 NOTE — Assessment & Plan Note (Signed)
Small and not tender If more pain, could consider cortisone injection by Dr Lorelei Pont

## 2019-05-30 NOTE — Progress Notes (Signed)
Subjective:    Patient ID: Harold Li, male    DOB: [birth year], 58 y.o.   MRN: [id number]  HPI Here for physical  No effect on his job with COVID No sig issues with this  Has noticed some slightly tender nodules in hands Especially noticed when fully extending--but he still can No change in hand function  Has noticed a few more headaches in the past month May be related to his neck Does go to chiropractor monthly Various times---- occipital, top and frontal Ice pack on neck does help No meds typically--but occasional OTC analgesics  Current Outpatient Medications on File Prior to Visit  Medication Sig Dispense Refill  . meloxicam (MOBIC) 15 MG tablet TAKE 1 TABLET (15 MG TOTAL) BY MOUTH DAILY AS NEEDED FOR PAIN. 30 tablet 2   No current facility-administered medications on file prior to visit.     Allergies  Allergen Reactions  . Citalopram Hydrobromide     REACTION: no energy, weak    Past Medical History:  Diagnosis Date  . Depression   . History of nephrolithiasis    Dr. Rosana Hoes  . Hyperlipidemia     Past Surgical History:  Procedure Laterality Date  . 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 Coronary artery disease Father   . Arthritis Father   . Stroke Father        in eye  . Diabetes Mother   . Arthritis Mother   . Colon cancer Neg Hx   . Pancreatic cancer Neg Hx   . Rectal cancer Neg Hx   . Stomach cancer Neg Hx     Social History   Socioeconomic History  . Marital status: Married    Spouse name: Not on file  . Number of children: Not on file  . Years of education: Not on file  . Highest education level: Not on file  Occupational History  . 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 Financial resource strain: Not on file  . Food insecurity    Worry: Not on file    Inability: Not on file  . Transportation needs   Medical: Not on file    Non-medical: Not on file  Tobacco Use  . Smoking status: Former Smoker    Types: Cigarettes    Quit date: 08/11/2009    Years since quitting: 9.8  . Smokeless tobacco: Never Used  Substance and Sexual Activity  . Alcohol use: Yes    Comment: extrememly rare about twice yearly  . Drug use: No  . Sexual activity: Not on file  Lifestyle  . Physical activity    Days per week: Not on file    Minutes per session: Not on file  . Stress: Not on file  Relationships  . Social Herbalist on phone: Not on file    Gets together: Not on file    Attends religious service: Not on file    Active member of club or organization: Not on file    Attends meetings of clubs or organizations: Not on file    Relationship status: Not on file  . Intimate partner violence    Fear of current or ex partner: Not on file    Emotionally abused: Not on file    Physically abused: Not on file    Forced sexual activity: Not on  file  Other Topics Concern  . Not on file  Social History Narrative   Married 1/15   Review of Systems  Constitutional: Negative for fatigue.       Weight up again slightly [facility] [employer]  HENT: Negative for dental problem, hearing loss and tinnitus.        Keeps up with dentist  Eyes: Negative for visual disturbance.       No diplopia or unilateral vision loss  Respiratory: Negative for cough, chest tightness and shortness of breath.   Cardiovascular: Negative for leg swelling.       Vague chest symptoms --brief and not concerning (once at work) occ palpitations--at rest  Gastrointestinal: Negative for abdominal pain, blood in stool and constipation.       No heartburn  Endocrine: Negative for polydipsia and polyuria.  Genitourinary: Negative for difficulty urinating and urgency.       No sexual problems  Musculoskeletal: Positive for back pain and neck pain. Negative for joint swelling.       Back has been better lately   Skin: Negative for rash.       Keeps up with derm  Allergic/Immunologic: Positive for environmental allergies. Negative for immunocompromised state.       Uses OTC meds prn  Neurological: Positive for headaches. Negative for dizziness, syncope and light-headedness.  Hematological: Negative for adenopathy. Does not bruise/bleed easily.  Psychiatric/Behavioral: Negative for dysphoric mood and sleep disturbance. The patient is not nervous/anxious.        Notices he gets "ill" more easily       Objective:   Physical Exam  Constitutional: He is oriented to person, place, and time. He appears well-developed. No distress.  HENT:  Head: Normocephalic and atraumatic.  Right Ear: External ear normal.  Left Ear: External ear normal.  Mouth/Throat: Oropharynx is clear and moist. No oropharyngeal exudate.  Eyes: Pupils are equal, round, and reactive to light. Conjunctivae are normal.  Neck: No thyromegaly present.  Cardiovascular: Normal rate, regular rhythm, normal heart sounds and intact distal pulses. Exam reveals no gallop.  No murmur heard. Respiratory: Effort normal and breath sounds normal. No respiratory distress. He has no wheezes. He has no rales.  GI: Soft. There is no abdominal tenderness.  Musculoskeletal:        General: No tenderness or edema.  Lymphadenopathy:    He has no cervical adenopathy.  Neurological: He is alert and oriented to person, place, and time.  Skin: No rash noted. No erythema.  Psychiatric: He has a normal mood and affect. His behavior is normal.           Assessment & Plan:

## 2019-06-02 ENCOUNTER — Telehealth: Payer: Self-pay

## 2019-06-02 NOTE — Telephone Encounter (Signed)
-----   Message from Milus Banister, MD sent at 06/02/2019 11:20 AM EDT ----- Thanks.  Not sure what happened with that reminder letter. I'll blame covid I guess.  He actually does not need repeat examination until July 2022 based on newest polyp surveillance guidelines;  1-2 subCM adenomas equals repeat colonoscopy at 7-10 years (not 5-10 as previously recommended).  We'll reach out to him to explain and change his recall date officially.  Thanks  Aariyah Sampey, Can you contact him, make a phone note out of this to document it all. Recall should be 03/2021 based on newest guidelines.  Thanks  ----- Message ----- From: Venia Carbon, MD Sent: 05/30/2019   8:22 AM EDT To: Milus Banister, MD  Linna Hoff, He was due for colonoscopy in July. He doesn't remember getting a letter but he is ready for this if your staff can contact him. Thanks, Denice Paradise

## 2019-06-02 NOTE — Telephone Encounter (Signed)
Recall changed and message left for pt to return

## 2019-06-03 NOTE — Telephone Encounter (Signed)
The patient has been notified of this information and all questions answered.  Recall in Epic

## 2019-06-03 NOTE — Telephone Encounter (Signed)
Left message on machine to call back  

## 2019-08-13 ENCOUNTER — Other Ambulatory Visit: Payer: Self-pay | Admitting: Internal Medicine

## 2019-08-23 DIAGNOSIS — L570 Actinic keratosis: Secondary | ICD-10-CM | POA: Diagnosis not present

## 2019-09-24 ENCOUNTER — Telehealth: Payer: Self-pay | Admitting: *Deleted

## 2019-09-24 DIAGNOSIS — Z9189 Other specified personal risk factors, not elsewhere classified: Secondary | ICD-10-CM | POA: Diagnosis not present

## 2019-09-24 DIAGNOSIS — R030 Elevated blood-pressure reading, without diagnosis of hypertension: Secondary | ICD-10-CM | POA: Diagnosis not present

## 2019-09-24 DIAGNOSIS — Z1152 Encounter for screening for COVID-19: Secondary | ICD-10-CM | POA: Diagnosis not present

## 2019-09-24 DIAGNOSIS — R0602 Shortness of breath: Secondary | ICD-10-CM | POA: Diagnosis not present

## 2019-09-24 NOTE — Telephone Encounter (Signed)
It certainly is terrible that we can't bring folks in for visits ---but he does have symptoms suggestive of COVID. Evaluation in the [facility] urgent care makes sense if his symptoms warrant it.

## 2019-09-24 NOTE — Telephone Encounter (Signed)
Patient called to schedule an appointment and was transferred to triage because of his symptoms. Patient stated that he started having problems with his heart beating harder on Friday. Patient stated that this comes and goes. Patient stated that he also has had some chest pressure and soreness in his upper back, but feels some better today. Patient stated that he has not had any chest pain, no nausea, no SOB and no left arm numbness or tingling. Patient stated that he was exposed to covid Christmas by a person that came to his home and no one was wearing a mask. Patient stated that his wife came down with a head cold and thinks that was over the weekend, but not sure exactly when her symptoms started. Patient stated that he started Saturday with a dry cough and a little head congestion. Advised patient that unfortunately we can not bring him into the office with his sudden onset of cold symptoms. Advised patient that he does need to go to an Urgent Care to be evaluated because he would probably need an EKG because of his heart symptoms. Patient stated that he will just live with it and is concerned if he is tested for covid he will have to be out of work until his results come back. Advised patient that the issue with his heart may be serious and he should not ignore that. Patient was given information on the [facility]. Patient stated that he will consider going there to be seen. Advised patient again his heart is nothing he should delay evaluation on and he verbalized understanding.

## 2019-09-26 NOTE — Telephone Encounter (Signed)
Patient called to let Dr. Silvio Pate know that he did go to [facility] in [facility] Wednesday and had a rapid test done that was positive. Patient stated that they also sent off the test and it came back positive today. Patient stated that they gave him some Prednisone and a Zpak to use if his symptoms get worse. Patient stated that they did an EKG and it was fine. Patient stated that he is not having any new symptoms. Patient stated that he is in quarantine until 10/06/19. Patient stated that he was advised to call and let him PCP know his results. ER precautions were given to patient and he verbalized understanding.

## 2019-09-28 NOTE — Telephone Encounter (Signed)
Please check on him on Monday and put him on the MyChart protocol

## 2019-09-29 NOTE — Telephone Encounter (Signed)
Noted. Patient is doing much better.

## 2019-10-22 ENCOUNTER — Ambulatory Visit
Admission: RE | Admit: 2019-10-22 | Discharge: 2019-10-22 | Disposition: A | Discharge status: Home / Self Care | Payer: BC Managed Care – PPO | Source: Ambulatory Visit | Attending: Unknown Physician Specialty | Admitting: Unknown Physician Specialty

## 2019-10-22 ENCOUNTER — Telehealth: Payer: Self-pay | Admitting: *Deleted

## 2019-10-22 ENCOUNTER — Other Ambulatory Visit: Payer: Self-pay

## 2019-10-22 ENCOUNTER — Ambulatory Visit
Admission: RE | Admit: 2019-10-22 | Payer: BC Managed Care – PPO | Source: Ambulatory Visit | Admitting: Internal Medicine

## 2019-10-22 ENCOUNTER — Ambulatory Visit (INDEPENDENT_AMBULATORY_CARE_PROVIDER_SITE_OTHER): Payer: Self-pay | Admitting: Unknown Physician Specialty

## 2019-10-22 VITALS — BP 146/82 | HR 83 | Temp 98.6°F | Wt 199.8 lb

## 2019-10-22 DIAGNOSIS — R059 Cough, unspecified: Secondary | ICD-10-CM

## 2019-10-22 DIAGNOSIS — R05 Cough: Secondary | ICD-10-CM

## 2019-10-22 DIAGNOSIS — J189 Pneumonia, unspecified organism: Secondary | ICD-10-CM

## 2019-10-22 DIAGNOSIS — J019 Acute sinusitis, unspecified: Secondary | ICD-10-CM

## 2019-10-22 MED ORDER — FLUTICASONE PROPIONATE 50 MCG/ACT NA SUSP: 2.0000 | Freq: Every day | NASAL | 6 refills | Status: DC

## 2019-10-22 MED ORDER — PREDNISONE 20 MG PO TABS: 40.0000 mg | ORAL_TABLET | Freq: Every day | ORAL | 0 refills | Status: DC

## 2019-10-22 MED ORDER — AMOXICILLIN 875 MG PO TABS: 875.0000 mg | ORAL_TABLET | Freq: Two times a day (BID) | ORAL | 0 refills | Status: DC

## 2019-10-22 NOTE — Progress Notes (Signed)
BP (!) 146/82   Pulse 83   Temp 98.6 F (37 C) (Oral)   Wt 199 lb 12.8 oz (90.6 kg)   SpO2 97%   BMI 30.60 kg/m    Subjective:    Patient ID: Harold Li, male    DOB: 07/14/1961, 59 y.o.   MRN: [id number]  HPI: LIM Harold Li is a 59 y.o. male  Chief Complaint  Patient presents with  . Cough    pt states he was COVID positive 4 weeks ago, started having a productive cough 2 days ago    Pt with Covid symptoms 4 days ago.  He has gotten better and returned to work and feeling at baseline.  2 days ago developed a cough after wife did. Pt states he received z pack at week 2 of illness   Cough This is a new problem. Episode onset: 2 days. The problem has been unchanged. The cough is productive of purulent sputum. Associated symptoms include nasal congestion, postnasal drip, rhinorrhea and sweats. Pertinent negatives include no chest pain, chills, ear congestion, ear pain, fever, headaches, heartburn, hemoptysis, myalgias, rash, sore throat, shortness of breath, weight loss or wheezing. Nothing aggravates the symptoms. He has tried nothing for the symptoms. There is no history of asthma, COPD or environmental allergies.   Relevant past medical, surgical, family and social history reviewed and updated as indicated. Interim medical history since our last visit reviewed. Allergies and medications reviewed and updated.  Review of Systems  Constitutional: Negative for chills, fever and weight loss.  HENT: Positive for postnasal drip and rhinorrhea. Negative for ear pain and sore throat.   Respiratory: Positive for cough. Negative for hemoptysis, shortness of breath and wheezing.   Cardiovascular: Negative for chest pain.  Gastrointestinal: Negative for heartburn.  Musculoskeletal: Negative for myalgias.  Skin: Negative for rash.  Allergic/Immunologic: Negative for environmental allergies.  Neurological: Negative for headaches.    Per HPI unless specifically indicated above       Objective:    BP (!) 146/82   Pulse 83   Temp 98.6 F (37 C) (Oral)   Wt 199 lb 12.8 oz (90.6 kg)   SpO2 97%   BMI 30.60 kg/m   Wt Readings from Last 3 Encounters:  10/22/19 199 lb 12.8 oz (90.6 kg)  05/30/19 195 lb (88.5 kg)  08/28/18 190 lb 4 oz (86.3 kg)    Physical Exam Vitals and nursing note reviewed.  Constitutional:      General: He is not in acute distress.    Appearance: Normal appearance. He is well-developed.  HENT:     Head: Normocephalic and atraumatic.     Right Ear: Tympanic membrane and ear canal normal.     Left Ear: Tympanic membrane and ear canal normal.     Nose: Congestion and rhinorrhea present.     Right Sinus: No maxillary sinus tenderness or frontal sinus tenderness.     Left Sinus: No maxillary sinus tenderness or frontal sinus tenderness.     Mouth/Throat:     Mouth: Mucous membranes are moist.     Pharynx: Uvula midline.  Eyes:     General: Lids are normal. No scleral icterus.       Right eye: No discharge.        Left eye: No discharge.     Conjunctiva/sclera: Conjunctivae normal.  Cardiovascular:     Rate and Rhythm: Normal rate and regular rhythm.     Heart sounds: Normal heart sounds.  Pulmonary:  Effort: Pulmonary effort is normal. No respiratory distress.     Breath sounds: Normal breath sounds.  Abdominal:     Palpations: There is no hepatomegaly or splenomegaly.  Musculoskeletal:        General: Normal range of motion.     Cervical back: Neck supple.  Skin:    General: Skin is warm and dry.     Coloration: Skin is not pale.     Findings: No rash.  Neurological:     Mental Status: He is alert and oriented to person, place, and time.  Psychiatric:        Behavior: Behavior normal.        Thought Content: Thought content normal.        Judgment: Judgment normal.     Results for orders placed or performed in visit on 99991111  Basic metabolic panel  Result Value Ref Range   Sodium 138 135 - 145 mEq/L   Potassium 4.1  3.5 - 5.1 mEq/L   Chloride 103 96 - 112 mEq/L   CO2 29 19 - 32 mEq/L   Glucose, Bld 105 (H) 70 - 99 mg/dL   BUN 15 6 - 23 mg/dL   Creatinine, Ser 1.15 0.40 - 1.50 mg/dL   Calcium 8.9 8.4 - 10.5 mg/dL   GFR 69.04 >60.00 mL/min      Assessment & Plan:   Problem List Items Addressed This Visit    None    Visit Diagnoses    Cough    -  Primary   Significant nasal congestion.  Chest x-ray suggests Covid pneumonia sequelae   Relevant Orders   DG Chest 2 View (Completed)   Pneumonia of both lungs due to infectious organism, unspecified part of lung       opacities suggestive of Covid related pneumonia.  Z pack given week 2 of illness.  Will rx Amoxil 875 mg BID with Prednisone 40 mg/day for 5 days.     Relevant Medications   amoxicillin (AMOXIL) 875 MG tablet   fluticasone (FLONASE) 50 MCG/ACT nasal spray   Acute non-recurrent sinusitis, unspecified location       Rx for Amoxil and Flonase   Relevant Medications   amoxicillin (AMOXIL) 875 MG tablet   predniSONE (DELTASONE) 20 MG tablet   fluticasone (FLONASE) 50 MCG/ACT nasal spray      Recommended out of work rest of this week.    Discuss f/u with PCP with video visit and f/u chest x-ray after several weeks to assure resolution  Follow up plan: With PCP

## 2019-10-22 NOTE — Telephone Encounter (Signed)
Patient called back stating that he has started with a productive cough/green. Patient stated that he wants to make sure that he doesn't get pneumonia. Patient denies fever, SOB or difficulty breathing. Appointment scheduled this evening at 6:15 pm at the [facility] per Dr. Silvio Pate.

## 2019-10-22 NOTE — Telephone Encounter (Signed)
Patient left a voicemail stating that he has been getting over covid the last 3 weeks. Patient stated that he has gotten worse the last couple of days.  Called patient back and got his voicemail. Left a message on voicemail for patient to call back.

## 2019-10-22 NOTE — Telephone Encounter (Signed)
Yes---he definitely needs an in person evaluation

## 2019-10-24 ENCOUNTER — Ambulatory Visit (INDEPENDENT_AMBULATORY_CARE_PROVIDER_SITE_OTHER): Payer: BC Managed Care – PPO | Admitting: Internal Medicine

## 2019-10-24 ENCOUNTER — Encounter: Payer: Self-pay | Admitting: Internal Medicine

## 2019-10-24 ENCOUNTER — Other Ambulatory Visit: Payer: Self-pay

## 2019-10-24 DIAGNOSIS — J1282 Pneumonia due to coronavirus disease 2019: Secondary | ICD-10-CM

## 2019-10-24 DIAGNOSIS — U071 COVID-19: Secondary | ICD-10-CM | POA: Diagnosis not present

## 2019-10-24 NOTE — Progress Notes (Signed)
Subjective:    Patient ID: Harold Li, male    DOB: [birth year], 59 y.o.   MRN: [id number]  HPI Video virtual visit for follow up after respiratory clinic evaluation Identification done Reviewed billing and he gave consent Participants---patient in his work, I am in my office  Had COVID infection Felt bad, in bed for several days but no major respiratory components Was done with quarantine and back at work--is exposed to paint/solvents (but does wear respiratory) Then developed cough 4 days ago. Felt chest congestion--and in his head No real SOB  Seen at respiratory clinic  Started on prednisone and amoxicillin Feels much better even yesterday  Current Outpatient Medications on File Prior to Visit  Medication Sig Dispense Refill  . amoxicillin (AMOXIL) 875 MG tablet Take 1 tablet (875 mg total) by mouth 2 (two) times daily. 20 tablet 0  . fluticasone (FLONASE) 50 MCG/ACT nasal spray Place 2 sprays into both nostrils daily. 16 g 6  . meloxicam (MOBIC) 15 MG tablet TAKE 1 TABLET (15 MG TOTAL) BY MOUTH DAILY AS NEEDED FOR PAIN. 30 tablet 2  . predniSONE (DELTASONE) 20 MG tablet Take 2 tablets (40 mg total) by mouth daily with breakfast. 10 tablet 0   No current facility-administered medications on file prior to visit.    Allergies  Allergen Reactions  . Citalopram Hydrobromide     REACTION: no energy, weak    Past Medical History:  Diagnosis Date  . Depression   . History of nephrolithiasis    Dr. Rosana Hoes  . Hyperlipidemia     Past Surgical History:  Procedure Laterality Date  . 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 Coronary artery disease Father   . Arthritis Father   . Stroke Father        in eye  . Diabetes Mother   . Arthritis Mother   . Colon cancer Neg Hx   . Pancreatic cancer Neg Hx   . Rectal cancer Neg Hx   . Stomach cancer Neg Hx     Social History   Socioeconomic History  . Marital  status: Married    Spouse name: Not on file  . Number of children: Not on file  . Years of education: Not on file  . Highest education level: Not on file  Occupational History  . 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 Smoking status: Former Smoker    Types: Cigarettes    Quit date: 08/11/2009    Years since quitting: 10.2  . Smokeless tobacco: Never Used  Substance and Sexual Activity  . Alcohol use: Yes    Comment: extrememly rare about twice yearly  . Drug use: No  . Sexual activity: Not on file  Other Topics Concern  . Not on file  Social History Narrative   Married 1/15   Social Determinants of Health   Financial Resource Strain:   . Difficulty of Paying Living Expenses: Not on file  Food Insecurity:   . Worried About Charity fundraiser in the Last Year: Not on file  . Ran Out of Food in the Last Year: Not on file  Transportation Needs:   . Lack of Transportation (Medical): Not on file  . Lack of Transportation (Non-Medical): Not on file  Physical Activity:   . Days of Exercise per Week: Not on file  .  Minutes of Exercise per Session: Not on file  Stress:   . Feeling of Stress : Not on file  Social Connections:   . Frequency of Communication with Friends and Family: Not on file  . Frequency of Social Gatherings with Friends and Family: Not on file  . Attends Religious Services: Not on file  . Active Member of Clubs or Organizations: Not on file  . Attends Archivist Meetings: Not on file  . Marital Status: Not on file  Intimate Partner Violence:   . Fear of Current or Ex-Partner: Not on file  . Emotionally Abused: Not on file  . Physically Abused: Not on file  . Sexually Abused: Not on file    Review of Systems Some allergies in spring No N/V     Objective:   Physical Exam  Constitutional: He appears well-developed. No distress.  Respiratory: Effort normal. No respiratory distress.    Psychiatric: He has a normal mood and affect. His behavior is normal.           Assessment & Plan:

## 2019-10-24 NOTE — Assessment & Plan Note (Signed)
CXR finding do show that when he was so sick in bed, he did have COVID pneumonia Was back at work a week before his symptoms got bad again---but I still suspect ongoing COVID with paint,etc exposure Much better after just one dose of prednisone I doubt the amoxicillin has made any difference (but will have him finish it out) If he relapses off the prednisone--would extend his course with 20 mg daily for 10 days If ongoing issues even after this, would consider pulmonary referral

## 2020-02-24 DIAGNOSIS — L570 Actinic keratosis: Secondary | ICD-10-CM | POA: Diagnosis not present

## 2020-02-24 DIAGNOSIS — C44229 Squamous cell carcinoma of skin of left ear and external auricular canal: Secondary | ICD-10-CM | POA: Diagnosis not present

## 2020-06-04 ENCOUNTER — Encounter: Payer: Self-pay | Admitting: Internal Medicine

## 2020-06-04 ENCOUNTER — Ambulatory Visit (INDEPENDENT_AMBULATORY_CARE_PROVIDER_SITE_OTHER): Payer: BC Managed Care – PPO | Admitting: Internal Medicine

## 2020-06-04 ENCOUNTER — Other Ambulatory Visit: Payer: Self-pay

## 2020-06-04 VITALS — BP 150/92 | HR 81 | Temp 97.2°F | Ht 67.5 in | Wt 197.5 lb

## 2020-06-04 DIAGNOSIS — E785 Hyperlipidemia, unspecified: Secondary | ICD-10-CM

## 2020-06-04 DIAGNOSIS — M159 Polyosteoarthritis, unspecified: Secondary | ICD-10-CM

## 2020-06-04 DIAGNOSIS — Z125 Encounter for screening for malignant neoplasm of prostate: Secondary | ICD-10-CM | POA: Diagnosis not present

## 2020-06-04 DIAGNOSIS — Z0000 Encounter for general adult medical examination without abnormal findings: Secondary | ICD-10-CM

## 2020-06-04 LAB — COMPREHENSIVE METABOLIC PANEL
ALT: 20 U/L (ref 0–53)
AST: 25 U/L (ref 0–37)
Albumin: 4.6 g/dL (ref 3.5–5.2)
Alkaline Phosphatase: 71 U/L (ref 39–117)
BUN: 17 mg/dL (ref 6–23)
CO2: 28 mEq/L (ref 19–32)
Calcium: 9.1 mg/dL (ref 8.4–10.5)
Chloride: 102 mEq/L (ref 96–112)
Creatinine, Ser: 1.22 mg/dL (ref 0.40–1.50)
GFR: 60.32 mL/min (ref >60.00)
Glucose, Bld: 94 mg/dL (ref 70–99)
Potassium: 3.9 mEq/L (ref 3.5–5.1)
Sodium: 138 mEq/L (ref 135–145)
Total Bilirubin: 0.6 mg/dL (ref 0.2–1.2)
Total Protein: 7.1 g/dL (ref 6.0–8.3)

## 2020-06-04 LAB — CBC
HCT: 46.9 % (ref 39.0–52.0)
Hemoglobin: 15.9 g/dL (ref 13.0–17.0)
MCHC: 33.8 g/dL (ref 30.0–36.0)
MCV: 92.4 fl (ref 78.0–100.0)
Platelets: 248 10*3/uL (ref 150.0–400.0)
RBC: 5.08 Mil/uL (ref 4.22–5.81)
RDW: 13.1 % (ref 11.5–15.5)
WBC: 5.8 10*3/uL (ref 4.0–10.5)

## 2020-06-04 LAB — LIPID PANEL
Cholesterol: 239 mg/dL — ABNORMAL HIGH (ref 0–200)
HDL: 58.9 mg/dL (ref >39.00)
LDL Cholesterol: 164 mg/dL — ABNORMAL HIGH (ref 0–99)
NonHDL: 180.46
Total CHOL/HDL Ratio: 4
Triglycerides: 84 mg/dL (ref 0.0–149.0)
VLDL: 16.8 mg/dL (ref 0.0–40.0)

## 2020-06-04 LAB — T4, FREE: Free T4: 0.73 ng/dL (ref 0.60–1.60)

## 2020-06-04 LAB — PSA: PSA: 0.51 ng/mL (ref 0.10–4.00)

## 2020-06-04 NOTE — Assessment & Plan Note (Signed)
Scattered spots but mild Uses the meloxicam rarely

## 2020-06-04 NOTE — Assessment & Plan Note (Signed)
Healthy Needs to get back to exercise Had COVID vaccine Flu vaccine at work Will check PSA Colon due 2022

## 2020-06-04 NOTE — Assessment & Plan Note (Signed)
Last was better No worrisome FH vascular disease

## 2020-06-04 NOTE — Progress Notes (Signed)
Subjective:    Patient ID: Harold Li, male    DOB: 1961-04-14, 59 y.o.   MRN: [id number]  HPI Here for physical This visit occurred during the SARS-CoV-2 public health emergency.  Safety protocols were in place, including screening questions prior to the visit, additional usage of staff PPE, and extensive cleaning of exam room while observing appropriate contact time as indicated for disinfecting solutions.   He has intermittent symptoms that he thinks he may be related to COVID Some headaches---but usually brief (frontal and vertex) Some afternoon fatigue---does seem to be improving Some vague stomach issues He did get vaccinated  Has noticed more nocturia--as often as 3-4 a night Still sleeps okay No sig daytime urgency or frequency  Uses meloxicam prn For knee, wrist, etc----but not that often  Current Outpatient Medications on File Prior to Visit  Medication Sig Dispense Refill  . meloxicam (MOBIC) 15 MG tablet TAKE 1 TABLET (15 MG TOTAL) BY MOUTH DAILY AS NEEDED FOR PAIN. 30 tablet 2   No current facility-administered medications on file prior to visit.    Allergies  Allergen Reactions  . Citalopram Hydrobromide     REACTION: no energy, weak    Past Medical History:  Diagnosis Date  . Depression   . History of nephrolithiasis    Dr. Rosana Hoes  . Hyperlipidemia     Past Surgical History:  Procedure Laterality Date  . 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 Coronary artery disease Father   . Arthritis Father   . Stroke Father        in eye  . Blindness Father   . Diabetes Mother   . Arthritis Mother   . Colon cancer Neg Hx   . Pancreatic cancer Neg Hx   . Rectal cancer Neg Hx   . Stomach cancer Neg Hx     Social History   Socioeconomic History  . Marital status: Married    Spouse name: Not on file  . Number of children: Not on file  . Years of education: Not on file  . Highest education  level: Not on file  Occupational History  . 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 Smoking status: Former Smoker    Types: Cigarettes    Quit date: 08/11/2009    Years since quitting: 10.8  . Smokeless tobacco: Never Used  Substance and Sexual Activity  . Alcohol use: Yes    Comment: extrememly rare about twice yearly  . Drug use: No  . Sexual activity: Not on file  Other Topics Concern  . Not on file  Social History Narrative   Married 1/15   Social Determinants of Health   Financial Resource Strain:   . Difficulty of Paying Living Expenses: Not on file  Food Insecurity:   . Worried About Charity fundraiser in the Last Year: Not on file  . Ran Out of Food in the Last Year: Not on file  Transportation Needs:   . Lack of Transportation (Medical): Not on file  . Lack of Transportation (Non-Medical): Not on file  Physical Activity:   . Days of Exercise per Week: Not on file  . Minutes of Exercise per Session: Not on file  Stress:   . Feeling of Stress : Not on file  Social Connections:   . Frequency of Communication with Friends and  Family: Not on file  . Frequency of Social Gatherings with Friends and Family: Not on file  . Attends Religious Services: Not on file  . Active Member of Clubs or Organizations: Not on file  . Attends Archivist Meetings: Not on file  . Marital Status: Not on file  Intimate Partner Violence:   . Fear of Current or Ex-Partner: Not on file  . Emotionally Abused: Not on file  . Physically Abused: Not on file  . Sexually Abused: Not on file   Review of Systems  Constitutional: Negative for unexpected weight change.       Wears seat belt  HENT: Negative for dental problem, hearing loss and tinnitus.        Keeps up with dentist  Eyes: Negative for visual disturbance.        Diplopia or unilateral vision loss  Respiratory: Negative for cough, chest tightness and shortness  of breath.   Cardiovascular: Positive for palpitations. Negative for leg swelling.       Some chest pain months ago--related to COVID infection. Gone now Palpitations were presenting symptom with COVID  Gastrointestinal: Negative for blood in stool and constipation.       No heartburn  Endocrine: Negative for polydipsia and polyuria.  Genitourinary: Negative for urgency.       Some nocturia No sexual problems  Musculoskeletal: Positive for arthralgias. Negative for gait problem and joint swelling.  Skin:       Recurrent infection around beard---got topical from dermatologist  Allergic/Immunologic: Negative for environmental allergies and immunocompromised state.  Neurological: Positive for headaches. Negative for dizziness, syncope and light-headedness.  Hematological: Negative for adenopathy. Bruises/bleeds easily.  Psychiatric/Behavioral: Negative for dysphoric mood and sleep disturbance. The patient is not nervous/anxious.        Not as patient now       Objective:   Physical Exam Constitutional:      Appearance: Normal appearance.  HENT:     Right Ear: Tympanic membrane, ear canal and external ear normal.     Left Ear: Tympanic membrane, ear canal and external ear normal.     Mouth/Throat:     Pharynx: No oropharyngeal exudate or posterior oropharyngeal erythema.  Eyes:     Conjunctiva/sclera: Conjunctivae normal.     Pupils: Pupils are equal, round, and reactive to light.  Cardiovascular:     Rate and Rhythm: Normal rate and regular rhythm.     Pulses: Normal pulses.     Heart sounds: No murmur heard.  No gallop.   Pulmonary:     Effort: Pulmonary effort is normal.     Breath sounds: Normal breath sounds. No wheezing or rales.  Abdominal:     Palpations: Abdomen is soft.     Tenderness: There is no abdominal tenderness.  Musculoskeletal:     Cervical back: Neck supple.     Right lower leg: No edema.     Left lower leg: No edema.  Lymphadenopathy:     Cervical: No  cervical adenopathy.  Skin:    Comments: Slight scaling along left side of beard  Neurological:     General: No focal deficit present.     Mental Status: He is alert and oriented to person, place, and time.  Psychiatric:        Mood and Affect: Mood normal.        Behavior: Behavior normal.            Assessment & Plan:

## 2020-08-20 DIAGNOSIS — L570 Actinic keratosis: Secondary | ICD-10-CM | POA: Diagnosis not present

## 2020-08-20 DIAGNOSIS — C44622 Squamous cell carcinoma of skin of right upper limb, including shoulder: Secondary | ICD-10-CM | POA: Diagnosis not present

## 2020-08-22 DIAGNOSIS — L03113 Cellulitis of right upper limb: Secondary | ICD-10-CM | POA: Diagnosis not present

## 2020-10-15 ENCOUNTER — Other Ambulatory Visit: Payer: Self-pay | Admitting: Internal Medicine

## 2021-02-10 DIAGNOSIS — M5451 Vertebrogenic low back pain: Secondary | ICD-10-CM | POA: Diagnosis not present

## 2021-02-10 DIAGNOSIS — M9904 Segmental and somatic dysfunction of sacral region: Secondary | ICD-10-CM | POA: Diagnosis not present

## 2021-02-10 DIAGNOSIS — M461 Sacroiliitis, not elsewhere classified: Secondary | ICD-10-CM | POA: Diagnosis not present

## 2021-02-10 DIAGNOSIS — M9903 Segmental and somatic dysfunction of lumbar region: Secondary | ICD-10-CM | POA: Diagnosis not present

## 2021-02-11 ENCOUNTER — Telehealth: Payer: Self-pay

## 2021-02-11 DIAGNOSIS — M5451 Vertebrogenic low back pain: Secondary | ICD-10-CM | POA: Diagnosis not present

## 2021-02-11 DIAGNOSIS — M9904 Segmental and somatic dysfunction of sacral region: Secondary | ICD-10-CM | POA: Diagnosis not present

## 2021-02-11 DIAGNOSIS — M461 Sacroiliitis, not elsewhere classified: Secondary | ICD-10-CM | POA: Diagnosis not present

## 2021-02-11 DIAGNOSIS — M9903 Segmental and somatic dysfunction of lumbar region: Secondary | ICD-10-CM | POA: Diagnosis not present

## 2021-02-11 NOTE — Telephone Encounter (Signed)
That appt should be fine

## 2021-02-11 NOTE — Telephone Encounter (Signed)
Patient came into the office today requesting an appointment with Dr. Silvio Pate for his high blood pressure. Patient was triaged by this RN. Patient reports "not feeling well" and high blood pressure for about three weeks. Patient stated that it has been as high as 163/97 after relaxing for a while. Patient reports having chest pain, palpitations, and a slight headache in the past, but is currently not experiencing these symptoms. Patient denied SOB, dizziness, blurred vision, lightheadedness, dizziness, or weakness. Vital signs obtained and are: 74, 160/84, and 99% on RA. Patient is A&O, no facial drooping, or slurred speech present. Patient is scheduled with Dr. Silvio Pate on 6/21 at 0815. Instructed patient to keep a detailed log of his blood pressures, drink plenty of water, and to avoid foods high in sodium. UC and ED precautions given. Patient verbalized understanding.

## 2021-02-14 DIAGNOSIS — M9904 Segmental and somatic dysfunction of sacral region: Secondary | ICD-10-CM | POA: Diagnosis not present

## 2021-02-14 DIAGNOSIS — M5451 Vertebrogenic low back pain: Secondary | ICD-10-CM | POA: Diagnosis not present

## 2021-02-14 DIAGNOSIS — M9903 Segmental and somatic dysfunction of lumbar region: Secondary | ICD-10-CM | POA: Diagnosis not present

## 2021-02-14 DIAGNOSIS — M461 Sacroiliitis, not elsewhere classified: Secondary | ICD-10-CM | POA: Diagnosis not present

## 2021-02-15 DIAGNOSIS — L570 Actinic keratosis: Secondary | ICD-10-CM | POA: Diagnosis not present

## 2021-02-17 DIAGNOSIS — M461 Sacroiliitis, not elsewhere classified: Secondary | ICD-10-CM | POA: Diagnosis not present

## 2021-02-17 DIAGNOSIS — M5451 Vertebrogenic low back pain: Secondary | ICD-10-CM | POA: Diagnosis not present

## 2021-02-17 DIAGNOSIS — M9904 Segmental and somatic dysfunction of sacral region: Secondary | ICD-10-CM | POA: Diagnosis not present

## 2021-02-17 DIAGNOSIS — M9903 Segmental and somatic dysfunction of lumbar region: Secondary | ICD-10-CM | POA: Diagnosis not present

## 2021-02-21 DIAGNOSIS — M461 Sacroiliitis, not elsewhere classified: Secondary | ICD-10-CM | POA: Diagnosis not present

## 2021-02-21 DIAGNOSIS — M9903 Segmental and somatic dysfunction of lumbar region: Secondary | ICD-10-CM | POA: Diagnosis not present

## 2021-02-21 DIAGNOSIS — M5451 Vertebrogenic low back pain: Secondary | ICD-10-CM | POA: Diagnosis not present

## 2021-02-21 DIAGNOSIS — M9904 Segmental and somatic dysfunction of sacral region: Secondary | ICD-10-CM | POA: Diagnosis not present

## 2021-03-01 ENCOUNTER — Encounter: Payer: Self-pay | Admitting: Internal Medicine

## 2021-03-01 ENCOUNTER — Other Ambulatory Visit: Payer: Self-pay

## 2021-03-01 ENCOUNTER — Ambulatory Visit: Payer: BC Managed Care – PPO | Admitting: Internal Medicine

## 2021-03-01 DIAGNOSIS — R0789 Other chest pain: Secondary | ICD-10-CM | POA: Diagnosis not present

## 2021-03-01 DIAGNOSIS — K219 Gastro-esophageal reflux disease without esophagitis: Secondary | ICD-10-CM | POA: Diagnosis not present

## 2021-03-01 DIAGNOSIS — I10 Essential (primary) hypertension: Secondary | ICD-10-CM | POA: Diagnosis not present

## 2021-03-01 DIAGNOSIS — R079 Chest pain, unspecified: Secondary | ICD-10-CM | POA: Insufficient documentation

## 2021-03-01 DIAGNOSIS — R0609 Other forms of dyspnea: Secondary | ICD-10-CM | POA: Insufficient documentation

## 2021-03-01 MED ORDER — LOSARTAN POTASSIUM-HCTZ 50-12.5 MG PO TABS: 1.0000 | ORAL_TABLET | Freq: Every day | ORAL | 3 refills | Status: DC

## 2021-03-01 MED ORDER — OMEPRAZOLE 20 MG PO CPDR: 20.0000 mg | DELAYED_RELEASE_CAPSULE | Freq: Every day | ORAL | 3 refills | Status: DC

## 2021-03-01 NOTE — Assessment & Plan Note (Addendum)
Very atypical---resting, no associated symptoms, etc More consistent with GERD Will check EKG  EKG--sinus at 62. Non specific T wave inversion in AVF. Left atrial enlargement. No ischemic changes. Since 11/30/05, no changes except the inverted T wave in AVF  Will treat with PPI Cardiology if persists/worsens

## 2021-03-01 NOTE — Progress Notes (Signed)
Subjective:    Patient ID: Harold Li, male    DOB: [birth year], 60 y.o.   MRN: [id number]  HPI Here due to concerns about his blood pressure This visit occurred during the SARS-CoV-2 public health emergency.  Safety protocols were in place, including screening questions prior to the visit, additional usage of staff PPE, and extensive cleaning of exam room while observing appropriate contact time as indicated for disinfecting solutions.   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 90 tablet 1   No current facility-administered medications on file prior to visit.    Allergies  Allergen Reactions   Citalopram Hydrobromide     REACTION: no energy, weak    Past Medical History:  Diagnosis Date   Depression    History of nephrolithiasis    Dr. 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    Family History  Problem Relation Age of Onset   Coronary artery disease Father    Arthritis Father    Stroke Father        in eye   Blindness Father    Diabetes Mother    Arthritis Mother    Stroke Mother    Colon cancer Neg Hx    Pancreatic cancer Neg Hx    Rectal cancer  Neg Hx    Stomach cancer Neg Hx     Social History   Socioeconomic History   Marital status: Married    Spouse name: Not on file   Number of children: Not on file   Years of education: Not on file   Highest education level: Not on file  Occupational History   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, [employer]  Tobacco Use   Smoking status: Former    Pack years: 0.00    Types: Cigarettes    Quit date: 08/11/2009    Years since quitting: 11.5   Smokeless tobacco: Never  Substance and Sexual Activity   Alcohol use: Yes    Comment: extrememly rare about twice yearly   Drug use: No   Sexual activity: Not on file  Other Topics Concern   Not on file  Social History Narrative   Married 1/15   Social Determinants of Health   Financial Resource Strain: Not on file  Food Insecurity: Not on file  Transportation Needs: Not on file  Physical Activity: Not on file  Stress: Not on file  Social Connections: Not on file  Intimate Partner Violence: Not on file   Review of Systems Energy levels not great Sleeps okay Appetite is fine---weight stable Some headaches--brief Rarely takes the meloxicam    Objective:   Physical  Exam Constitutional:      Appearance: Normal appearance.  Cardiovascular:     Rate and Rhythm: Normal rate and regular rhythm.     Pulses: Normal pulses.     Heart sounds: No murmur heard.   No gallop.  Pulmonary:     Effort: Pulmonary effort is normal.     Breath sounds: Normal breath sounds. No wheezing or rales.  Abdominal:     Palpations: Abdomen is soft.     Comments: Very slight tenderness below large RUQ incision (near midline)  Musculoskeletal:     Cervical back: Neck supple.     Right lower leg: No edema.     Left lower leg: No edema.  Lymphadenopathy:     Cervical: No cervical adenopathy.  Neurological:     Mental Status: He is alert.  Psychiatric:        Mood and Affect: Mood normal.        Behavior:  Behavior normal.           Assessment & Plan:

## 2021-03-01 NOTE — Assessment & Plan Note (Signed)
Will try empiric PPI therapy and reevaluate at his physical

## 2021-03-01 NOTE — Assessment & Plan Note (Signed)
Persistent elevations with symptoms---headache, etc Will start medication----losartan/HCTZ Recheck at his upcoming PE

## 2021-04-25 ENCOUNTER — Ambulatory Visit (INDEPENDENT_AMBULATORY_CARE_PROVIDER_SITE_OTHER): Payer: BC Managed Care – PPO

## 2021-04-25 ENCOUNTER — Encounter: Payer: Self-pay | Admitting: Emergency Medicine

## 2021-04-25 ENCOUNTER — Ambulatory Visit
Admission: EM | Admit: 2021-04-25 | Discharge: 2021-04-25 | Disposition: A | Discharge status: Home / Self Care | Payer: BC Managed Care – PPO | Attending: Emergency Medicine | Admitting: Emergency Medicine

## 2021-04-25 ENCOUNTER — Emergency Department
Admission: EM | Admit: 2021-04-25 | Discharge: 2021-04-25 | Disposition: A | Discharge status: Home / Self Care | Payer: BC Managed Care – PPO | Attending: Emergency Medicine | Admitting: Emergency Medicine

## 2021-04-25 ENCOUNTER — Telehealth: Payer: Self-pay

## 2021-04-25 ENCOUNTER — Ambulatory Visit: Payer: BC Managed Care – PPO

## 2021-04-25 ENCOUNTER — Emergency Department: Payer: BC Managed Care – PPO

## 2021-04-25 ENCOUNTER — Other Ambulatory Visit: Payer: Self-pay

## 2021-04-25 DIAGNOSIS — R079 Chest pain, unspecified: Secondary | ICD-10-CM

## 2021-04-25 DIAGNOSIS — R002 Palpitations: Secondary | ICD-10-CM | POA: Insufficient documentation

## 2021-04-25 DIAGNOSIS — R0789 Other chest pain: Secondary | ICD-10-CM | POA: Diagnosis not present

## 2021-04-25 DIAGNOSIS — I10 Essential (primary) hypertension: Secondary | ICD-10-CM | POA: Insufficient documentation

## 2021-04-25 DIAGNOSIS — R0602 Shortness of breath: Secondary | ICD-10-CM

## 2021-04-25 DIAGNOSIS — Z79899 Other long term (current) drug therapy: Secondary | ICD-10-CM | POA: Diagnosis not present

## 2021-04-25 DIAGNOSIS — Z87891 Personal history of nicotine dependence: Secondary | ICD-10-CM | POA: Insufficient documentation

## 2021-04-25 DIAGNOSIS — R0609 Other forms of dyspnea: Secondary | ICD-10-CM | POA: Insufficient documentation

## 2021-04-25 HISTORY — DX: Essential (primary) hypertension: I10

## 2021-04-25 LAB — BASIC METABOLIC PANEL
Anion gap: 9 (ref 5–15)
BUN: 22 mg/dL (ref 8–23)
CO2: 27 mmol/L (ref 22–32)
Calcium: 8.8 mg/dL — ABNORMAL LOW (ref 8.9–10.3)
Chloride: 103 mmol/L (ref 98–111)
Creatinine, Ser: 1.36 mg/dL — ABNORMAL HIGH (ref 0.61–1.24)
GFR, Estimated: 59 mL/min — ABNORMAL LOW (ref >60)
Glucose, Bld: 92 mg/dL (ref 70–99)
Potassium: 3.8 mmol/L (ref 3.5–5.1)
Sodium: 139 mmol/L (ref 135–145)

## 2021-04-25 LAB — CBC
HCT: 42.3 % (ref 39.0–52.0)
Hemoglobin: 14.9 g/dL (ref 13.0–17.0)
MCH: 31.6 pg (ref 26.0–34.0)
MCHC: 35.2 g/dL (ref 30.0–36.0)
MCV: 89.6 fL (ref 80.0–100.0)
Platelets: 258 10*3/uL (ref 150–400)
RBC: 4.72 MIL/uL (ref 4.22–5.81)
RDW: 12.3 % (ref 11.5–15.5)
WBC: 6 10*3/uL (ref 4.0–10.5)
nRBC: 0 % (ref 0.0–0.2)

## 2021-04-25 LAB — TROPONIN I (HIGH SENSITIVITY): Troponin I (High Sensitivity): 14 ng/L (ref <18)

## 2021-04-25 NOTE — ED Triage Notes (Signed)
Pt here with CP and SOB for a few weeks that has gotten worse. Pt states pain is centered and does not radiate. Pt denies N/V/D. Pt sent here from [facility].

## 2021-04-25 NOTE — Telephone Encounter (Signed)
Will keep my appt in ~2 weeks Being referred to cardiology via the ER---please make sure that referral happens

## 2021-04-25 NOTE — Discharge Instructions (Addendum)
Please call the number provided for cardiology to arrange a follow-up appointment as I do believe you would benefit from a stress test and possible echocardiogram.  Please return to the emergency department for any significant chest pain, shortness of breath, or any other symptom personally concerning to yourself.

## 2021-04-25 NOTE — ED Triage Notes (Signed)
Pt here with centralized chest pain with SOB with exertion over several weeks. Reports it has gotten much worse since last night. Pt was told by [place] to come to UC.

## 2021-04-25 NOTE — Telephone Encounter (Signed)
[place] [facility] - Client TELEPHONE ADVICE RECORD [facility] Patient Name: Harold Li Gender: Male DOB: 04/28/1961 Age: 60 Y 2 M 28 D Return Phone Number: [phone] (Primary) Address[place] [address] Client [facility] - Client Client Site [place] - Day Physician Viviana Simpler- MD Contact Type Call Who Is Calling Patient / Member / Family / Caregiver Call Type Triage / Clinical Relationship To Patient Self Return Phone Number [phone] (Primary) Chief Complaint BREATHING - shortness of breath or sounds breathless Reason for Call Symptomatic / Request for [place] states pt is having shortness of breath and no energy. Translation No Nurse Assessment Nurse: Cira Servant, RN, Suanne Marker Date/Time (Eastern Time): 04/25/2021 8:54:47 AM Confirm and document reason for call. If symptomatic, describe symptoms. ---Caller states pt is having shortness of breath and no energy since COVID. Does the patient have any new or worsening symptoms? ---Yes Will a triage be completed? ---Yes Related visit to physician within the last 2 weeks? ---No Does the PT have any chronic conditions? (i.e. diabetes, asthma, this includes High risk factors for pregnancy, etc.) ---Yes List chronic conditions. ---HTN; Is this a behavioral health or substance abuse call? ---No Guidelines Guideline Title Affirmed Question Affirmed Notes Nurse Date/Time (Eastern Time) Chest Pain [1] Chest pain lasts > 5 minutes AND [2] occurred in past 3 days (72 hours) (Exception: feels exactly the same as previously diagnosed heartburn and has accompanying sour taste in mouth) Cira Servant, RN, Suanne Marker 04/25/2021 8:57:01 AM PLEASE NOTE: All timestamps contained within this report are represented as [place] Standard Time. CONFIDENTIALTY NOTICE: This fax transmission is intended only  for the addressee. It contains information that is legally privileged, confidential or otherwise protected from use or disclosure. If you are not the intended recipient, you are strictly prohibited from reviewing, disclosing, copying using or disseminating any of this information or taking any action in reliance on or regarding this information. If you have received this fax in error, please notify us immediately by telephone so that we can arrange for its return to [place]. Phone: [phone], Toll-Free: [phone], Fax: [phone] Page: 2 of 2 Call Id: [id number] [place]. Time Eilene Ghazi Time) Disposition Final User 04/25/2021 8:46:25 AM Attempt made - message left [place] 04/25/2021 9:02:05 AM Go to ED Now (or PCP triage) Yes Cira Servant, RN, Rosalyn Charters Disagree/Comply Comply Caller Understands Yes PreDisposition InappropriateToAsk Care Advice Given Per Guideline GO TO ED NOW (OR PCP TRIAGE): * IF NO PCP (PRIMARY CARE PROVIDER) SECOND-LEVEL TRIAGE: You need to be seen within the next hour. Go to the [facility] at _____________ [place] as soon as you can. BRING MEDICINES: * Please bring a list of your current medicines when you go to see the doctor. * It is also a good idea to bring the pill bottles too. This will help the doctor to make certain you are taking the right medicines and the right dose. CALL EMS IF: * Severe difficulty breathing occurs * Passes out or becomes too weak to stand * You become worse CARE ADVICE given per Chest Pain (Adult) guideline. Referrals GO TO FACILITY UNDECIDED

## 2021-04-25 NOTE — ED Provider Notes (Signed)
CHIEF COMPLAINT:   Chief Complaint  Patient presents with   Shortness of Breath   Chest Pain     SUBJECTIVE/HPI:  HPI A very pleasant 60 y.o.Male presents today with shortness of breath and chest pain which has been off and on for the last couple of months.  Patient reports that his shortness of breath has started to worsen and endorses being short of breath with activity which is uncommon for him.  Patient reported to his primary care office today that he had approximately 45 minutes of off-and-on sharp to dull chest pain today.  Patient reports that his symptoms have gotten much worse since last night.  Patient was sent here by his primary care practice for evaluation. Patient does not report any palpitations, visual changes, weakness, tingling, headache, nausea, vomiting, diarrhea, fever, chills.   has a past medical history of Depression, History of nephrolithiasis, Hyperlipidemia, and Hypertension. ROS:  Review of Systems See Subjective/HPI Medications, Allergies and Problem List personally reviewed in Epic today OBJECTIVE:   Vitals:   04/25/21 1049  BP: 113/75  Pulse: 65  Resp: 16  Temp: 98.6 F (37 C)  SpO2: 96%    Physical Exam   General: Appears well-developed and well-nourished. No acute distress.  HEENT Head: Normocephalic and atraumatic.  Ears: Hearing grossly intact, no drainage or visible deformity.  Nose: No nasal deviation or rhinorrhea.  Mouth/Throat: No stridor or tracheal deviation.  Eyes: Conjunctivae and EOM are normal. No eye drainage or scleral icterus bilaterally.  Neck: Normal range of motion, neck is supple.  Cardiovascular: Normal rate . Regular rhythm; no murmurs, gallops, or rubs.  Pulm/Chest: No respiratory distress. Breath sounds normal bilaterally without wheezes, rhonchi, or rales.  Musculoskeletal: No joint deformity, normal range of motion.  Neurological: Alert and oriented to person, place, and time.  Skin: Skin is warm and dry.   Psychiatric: Normal mood, affect, behavior, and thought content.   Vital signs and nursing note reviewed.   Patient stable and cooperative with examination.  LABS/X-RAYS/EKG/MEDS:   No results found for any visits on 04/25/21.  MEDICAL DECISION MAKING:   Patient presents with shortness of breath and chest pain which has been off and on for the last couple of months.  Patient reports that his shortness of breath has started to worsen and endorses being short of breath with activity which is uncommon for him.  Patient reported to his primary care office today that he had approximately 45 minutes of off-and-on sharp to dull chest pain today.  Patient endorses some chest tightness.  Patient reports that his symptoms have gotten much worse since last night.  Patient was sent here by his primary care practice for evaluation. Patient does not report any palpitations, visual changes, weakness, tingling, headache, nausea, vomiting, diarrhea, fever, chills.  No history of blood clotting disorders for himself or family members.  Chart review completed.  ECG shows NSR with no acute ST or T wave changes noted, as read by me, overread pending.  CXR: Reveals no pneumonia or pneumothorax, as read by me, overread pending.  Examination in clinic today is benign, but given the patient's concerns of chest pain and shortness of breath I do feel as if he would benefit from further work-up in the emergency department.  Chest pain is not reproducible upon palpation.  Concern for costochondritis versus ACS versus PE.  Patient to present to the emergency department via private vehicle with his spouse.  Stable on discharge.  Return as needed.  ASSESSMENT/PLAN:  1. Chest pain, unspecified type  2. Shortness of breath    Plan:   Discharge Instructions      Your current condition warrants further evaluation and/or treatment which exceed services available to you in this urgent care setting. I have discussed with you  your currrent condition and the need for further evaluation and/or treatment in an emergency department setting. In response to my medical recommendation, you have opted to go to the emergency department.         Serafina Royals, [place] 04/25/21 1117

## 2021-04-25 NOTE — ED Notes (Signed)
Hooked pt to monitor w/cardiac.  Pt reports no CP or SHOB at this time. States symptoms intermittent.

## 2021-04-25 NOTE — Discharge Instructions (Addendum)
Your current condition warrants further evaluation and/or treatment which exceed services available to you in this urgent care setting. I have discussed with you your currrent condition and the need for further evaluation and/or treatment in an emergency department setting. In response to my medical recommendation, you have opted to go to the emergency department. 

## 2021-04-25 NOTE — Telephone Encounter (Signed)
I spoke with pt; he is presently at work but pt is going on break and is going to call his wife and discuss what to do with her. Pt said has had SOB on and off for couple months but the SOB is worsening; pt said this morning for the last 49' pt has had on and off sharpe to dull mid chest pain. After talking with his wife pt thinks he will go to [facility] in [place]. Pt said he will be able to get there, no 911 needed. Again UC & ED precautions given and pt voiced understanding. Sending note to DR Silvio Pate and Larene Beach CMA.

## 2021-04-25 NOTE — ED Notes (Signed)
MD in room with pt

## 2021-04-25 NOTE — Telephone Encounter (Signed)
Pt went to the ER today. Has OV 05-06-21.

## 2021-04-25 NOTE — ED Provider Notes (Signed)
[facility] Emergency Department Provider Note  Time seen: 1:20 PM  I have reviewed the triage vital signs and the nursing notes.   HISTORY  Chief Complaint Chest Pain and Shortness of Breath   HPI Harold Li is a 60 y.o. male with a past medical history of depression, hypertension, hyperlipidemia, presents to the emergency department for chest pain and shortness of breath.  According to the patient over the past 3 to 4 weeks he has been experiencing intermittent chest discomfort as well as shortness of breath worse with exertion.  Patient states he has noticed for instance when mowing the lawn he will have to stop and rest as he is getting short of breath and feeling heart palpitations.  States at various times he will also feel discomfort in the chest although there is not correlate exertion with the discomfort states that discomfort will come randomly for instance this morning while at work he had approximately 1 hour of some mild central chest discomfort.  Patient has seen his PCP for the same, they started him on gastric reflux medications but noted no improvement.   Past Medical History:  Diagnosis Date   Depression    History of nephrolithiasis    Dr. Rosana Hoes   Hyperlipidemia    Hypertension     Patient Active Problem List   Diagnosis Date Noted   Benign essential HTN 03/01/2021   Chest pain 03/01/2021   GERD (gastroesophageal reflux disease) 03/01/2021   Hyperlipidemia    Routine general medical examination at a health care facility 06/01/2011   History of nephrolithiasis    Generalized osteoarthritis 09/02/2010   ERECTILE DYSFUNCTION, ORGANIC 03/11/2010    Past Surgical History:  Procedure Laterality Date   PARTIAL NEPHRECTOMY  09/2009   for oncocytoma    Prior to Admission medications   Medication Sig Start Date End Date Taking? Authorizing Provider  losartan-hydrochlorothiazide (HYZAAR) 50-12.5 MG tablet Take 1 tablet by mouth daily.  03/01/21   Venia Carbon, MD  meloxicam (MOBIC) 15 MG tablet TAKE 1 TABLET (15 MG TOTAL) BY MOUTH DAILY AS NEEDED FOR PAIN. 10/15/20   Venia Carbon, MD  omeprazole (PRILOSEC) 20 MG capsule Take 1 capsule (20 mg total) by mouth daily. 03/01/21   Venia Carbon, MD    Allergies  Allergen Reactions   Citalopram Hydrobromide     REACTION: no energy, weak    Family History  Problem Relation Age of Onset   Coronary artery disease Father    Arthritis Father    Stroke Father        in eye   Blindness Father    Diabetes Mother    Arthritis Mother    Stroke Mother    Colon cancer Neg Hx    Pancreatic cancer Neg Hx    Rectal cancer Neg Hx    Stomach cancer Neg Hx     Social History Social History   Tobacco Use   Smoking status: Former    Types: Cigarettes    Quit date: 08/11/2009    Years since quitting: 11.7   Smokeless tobacco: Never  Substance Use Topics   Alcohol use: Yes    Comment: extrememly rare about twice yearly   Drug use: No    Review of Systems Constitutional: Negative for fever. Cardiovascular: Intermittent chest pain x3 to 4 weeks Respiratory: Patient has been experiencing shortness of breath worse with exertion over the past 3 to 4 weeks.  Denies any recent illnesses fever cough  or congestion. Gastrointestinal: Negative for abdominal pain, vomiting and diarrhea. Musculoskeletal: Negative for musculoskeletal complaints Neurological: Negative for headache All other ROS negative  ____________________________________________   PHYSICAL EXAM:  VITAL SIGNS: ED Triage Vitals  Enc Vitals Group     BP 04/25/21 1138 110/66     Pulse Rate 04/25/21 1138 61     Resp 04/25/21 1138 18     Temp 04/25/21 1138 98.3 F (36.8 C)     Temp Source 04/25/21 1138 Oral     SpO2 04/25/21 1138 100 %     Weight 04/25/21 1139 190 lb (86.2 kg)     Height 04/25/21 1139 '5\' 8"'$  (1.727 m)     Head Circumference --      Peak Flow --      Pain Score 04/25/21 1139 4      Pain Loc --      Pain Edu? --      Excl. in [place]? --    Constitutional: Alert and oriented. Well appearing and in no distress. Eyes: Normal exam ENT      Head: Normocephalic and atraumatic.      Mouth/Throat: Mucous membranes are moist. Cardiovascular: Normal rate, regular rhythm.  Respiratory: Normal respiratory effort without tachypnea nor retractions. Breath sounds are clear  Gastrointestinal: Soft and nontender. No distention.  Musculoskeletal: Nontender with normal range of motion in all extremities.  Neurologic:  Normal speech and language. No gross focal neurologic deficits Skin:  Skin is warm, dry and intact.  Psychiatric: Mood and affect are normal. Speech and behavior are normal.   ____________________________________________    EKG  EKG viewed and interpreted by myself shows a normal sinus rhythm at 66 bpm with a narrow QRS, normal axis, normal intervals, no concerning ST changes.  ____________________________________________    RADIOLOGY  Chest x-ray negative  ____________________________________________   INITIAL IMPRESSION / ASSESSMENT AND PLAN / ED COURSE  Pertinent labs & imaging results that were available during my care of the patient were reviewed by me and considered in my medical decision making (see chart for details).   Patient presents to the emergency department for shortness of breath with exertion intermittent chest pain occurring over the past 3 to 4 weeks.  Overall the patient appears well denies any symptoms currently.  States he did have chest pain this morning lasting for approximately 1 hour.  Overall patient appears well, reassuring vitals, reassuring physical exam.  Patient's lab work is reassuring as well including a negative troponin, reassuring EKG and negative chest x-ray.  I do believe the patient would benefit from cardiology follow-up for stress test echocardiogram and possibly a Holter monitor.  However from an acute aspect I believe the  patient is safe for discharge home with cardiology follow-up.  Patient will be discharged with cardiology follow-up.  Discussed my typical return precautions.  Harold Li was evaluated in Emergency Department on 04/25/2021 for the symptoms described in the history of present illness. He was evaluated in the context of the global COVID-19 pandemic, which necessitated consideration that the patient might be at risk for infection with the SARS-CoV-2 virus that causes COVID-19. Institutional protocols and algorithms that pertain to the evaluation of patients at risk for COVID-19 are in a state of rapid change based on information released by regulatory bodies including the CDC and federal and state organizations. These policies and algorithms were followed during the patient's care in the ED.  ____________________________________________   FINAL CLINICAL IMPRESSION(S) / ED DIAGNOSES  Chest pain  Dyspnea   Harvest Dark, MD 04/25/21 1427

## 2021-04-26 NOTE — Telephone Encounter (Signed)
The hospital did not place a referral to Cardiology Please place as STAT  Thanks!

## 2021-04-26 NOTE — Telephone Encounter (Signed)
Consult ordered.

## 2021-05-03 DIAGNOSIS — I10 Essential (primary) hypertension: Secondary | ICD-10-CM | POA: Diagnosis not present

## 2021-05-03 DIAGNOSIS — R0602 Shortness of breath: Secondary | ICD-10-CM | POA: Diagnosis not present

## 2021-05-03 DIAGNOSIS — R079 Chest pain, unspecified: Secondary | ICD-10-CM | POA: Diagnosis not present

## 2021-05-03 DIAGNOSIS — R9431 Abnormal electrocardiogram [ECG] [EKG]: Secondary | ICD-10-CM | POA: Diagnosis not present

## 2021-05-06 ENCOUNTER — Ambulatory Visit: Payer: BC Managed Care – PPO | Admitting: Internal Medicine

## 2021-05-13 DIAGNOSIS — R079 Chest pain, unspecified: Secondary | ICD-10-CM | POA: Diagnosis not present

## 2021-05-26 DIAGNOSIS — R079 Chest pain, unspecified: Secondary | ICD-10-CM | POA: Diagnosis not present

## 2021-05-30 DIAGNOSIS — I10 Essential (primary) hypertension: Secondary | ICD-10-CM | POA: Diagnosis not present

## 2021-05-30 DIAGNOSIS — E669 Obesity, unspecified: Secondary | ICD-10-CM | POA: Diagnosis not present

## 2021-05-30 DIAGNOSIS — R0602 Shortness of breath: Secondary | ICD-10-CM | POA: Diagnosis not present

## 2021-05-30 DIAGNOSIS — R9431 Abnormal electrocardiogram [ECG] [EKG]: Secondary | ICD-10-CM | POA: Diagnosis not present

## 2021-06-06 ENCOUNTER — Encounter: Payer: BC Managed Care – PPO | Admitting: Internal Medicine

## 2021-06-14 DIAGNOSIS — R9431 Abnormal electrocardiogram [ECG] [EKG]: Secondary | ICD-10-CM | POA: Diagnosis not present

## 2021-06-17 DIAGNOSIS — R079 Chest pain, unspecified: Secondary | ICD-10-CM | POA: Diagnosis not present

## 2021-06-17 DIAGNOSIS — R0602 Shortness of breath: Secondary | ICD-10-CM | POA: Diagnosis not present

## 2021-06-17 DIAGNOSIS — E669 Obesity, unspecified: Secondary | ICD-10-CM | POA: Diagnosis not present

## 2021-06-17 DIAGNOSIS — I10 Essential (primary) hypertension: Secondary | ICD-10-CM | POA: Diagnosis not present

## 2021-07-05 ENCOUNTER — Telehealth: Payer: Self-pay

## 2021-07-05 NOTE — Telephone Encounter (Signed)
Pt was scheduled for a 15 min appt tomorrow (10-26) for medication refill. I changed it to a CPE because he is due for 1 and we had the time available.

## 2021-07-06 ENCOUNTER — Ambulatory Visit: Payer: BC Managed Care – PPO | Admitting: Internal Medicine

## 2021-07-06 ENCOUNTER — Telehealth: Payer: Self-pay

## 2021-07-06 ENCOUNTER — Other Ambulatory Visit: Payer: Self-pay

## 2021-07-06 ENCOUNTER — Encounter: Payer: Self-pay | Admitting: Internal Medicine

## 2021-07-06 VITALS — BP 130/84 | HR 73 | Temp 97.3°F | Ht 67.5 in | Wt 198.0 lb

## 2021-07-06 DIAGNOSIS — R0609 Other forms of dyspnea: Secondary | ICD-10-CM | POA: Diagnosis not present

## 2021-07-06 DIAGNOSIS — K2100 Gastro-esophageal reflux disease with esophagitis, without bleeding: Secondary | ICD-10-CM | POA: Diagnosis not present

## 2021-07-06 DIAGNOSIS — Z23 Encounter for immunization: Secondary | ICD-10-CM

## 2021-07-06 DIAGNOSIS — Z0000 Encounter for general adult medical examination without abnormal findings: Secondary | ICD-10-CM

## 2021-07-06 DIAGNOSIS — I10 Essential (primary) hypertension: Secondary | ICD-10-CM | POA: Diagnosis not present

## 2021-07-06 LAB — COMPREHENSIVE METABOLIC PANEL
ALT: 33 U/L (ref 0–53)
AST: 36 U/L (ref 0–37)
Albumin: 4.5 g/dL (ref 3.5–5.2)
Alkaline Phosphatase: 64 U/L (ref 39–117)
BUN: 16 mg/dL (ref 6–23)
CO2: 29 mEq/L (ref 19–32)
Calcium: 9.2 mg/dL (ref 8.4–10.5)
Chloride: 106 mEq/L (ref 96–112)
Creatinine, Ser: 1.13 mg/dL (ref 0.40–1.50)
GFR: 69.69 mL/min (ref >60.00)
Glucose, Bld: 93 mg/dL (ref 70–99)
Potassium: 4.3 mEq/L (ref 3.5–5.1)
Sodium: 141 mEq/L (ref 135–145)
Total Bilirubin: 0.7 mg/dL (ref 0.2–1.2)
Total Protein: 6.8 g/dL (ref 6.0–8.3)

## 2021-07-06 LAB — LIPID PANEL
Cholesterol: 213 mg/dL — ABNORMAL HIGH (ref 0–200)
HDL: 53.2 mg/dL (ref >39.00)
LDL Cholesterol: 133 mg/dL — ABNORMAL HIGH (ref 0–99)
NonHDL: 159.54
Total CHOL/HDL Ratio: 4
Triglycerides: 134 mg/dL (ref 0.0–149.0)
VLDL: 26.8 mg/dL (ref 0.0–40.0)

## 2021-07-06 LAB — CBC
HCT: 42.5 % (ref 39.0–52.0)
Hemoglobin: 14.2 g/dL (ref 13.0–17.0)
MCHC: 33.5 g/dL (ref 30.0–36.0)
MCV: 93.5 fl (ref 78.0–100.0)
Platelets: 245 10*3/uL (ref 150.0–400.0)
RBC: 4.54 Mil/uL (ref 4.22–5.81)
RDW: 13.6 % (ref 11.5–15.5)
WBC: 5.3 10*3/uL (ref 4.0–10.5)

## 2021-07-06 LAB — T4, FREE: Free T4: 0.89 ng/dL (ref 0.60–1.60)

## 2021-07-06 NOTE — Assessment & Plan Note (Signed)
BP Readings from Last 3 Encounters:  07/06/21 130/84  04/25/21 134/88  04/25/21 113/75   Okay on losartan HCTZ Will recheck labs

## 2021-07-06 NOTE — Progress Notes (Signed)
Subjective:    Patient ID: Harold Li, male    DOB: December 27, 1960, 60 y.o.   MRN: [id number]  HPI Here for physical This visit occurred during the SARS-CoV-2 public health emergency.  Safety protocols were in place, including screening questions prior to the visit, additional usage of staff PPE, and extensive cleaning of exam room while observing appropriate contact time as indicated for disinfecting solutions.   Had ER visit for chest pain Referred to Dr Drue Novel stress test, echo and CT scan (calcium score 33) Is going back for follow up  Noticed some blood in stool yesterday None today No problems with bowels otherwise  Pinched nerve in neck--with left hand numbness (thumb and next 2 fingers) Some better with chiropractic Uses meloxicam rarely  Current Outpatient Medications on File Prior to Visit  Medication Sig Dispense Refill   losartan-hydrochlorothiazide (HYZAAR) 50-12.5 MG tablet Take 1 tablet by mouth daily. 90 tablet 3   meloxicam (MOBIC) 15 MG tablet TAKE 1 TABLET (15 MG TOTAL) BY MOUTH DAILY AS NEEDED FOR PAIN. 90 tablet 1   omeprazole (PRILOSEC) 20 MG capsule Take 1 capsule (20 mg total) by mouth daily. 90 capsule 3   acyclovir (ZOVIRAX) 200 MG capsule Take by mouth.     No current facility-administered medications on file prior to visit.    Allergies  Allergen Reactions   Citalopram Hydrobromide     REACTION: no energy, weak    Past Medical History:  Diagnosis Date   Depression    History of nephrolithiasis    Dr. Rosana Hoes   Hyperlipidemia    Hypertension     Past Surgical History:  Procedure Laterality Date   PARTIAL NEPHRECTOMY  09/2009   for oncocytoma    Family History  Problem Relation Age of Onset   Coronary artery disease Father    Arthritis Father    Stroke Father        in eye   Blindness Father    Diabetes Mother    Arthritis Mother    Stroke Mother    Colon cancer Neg Hx    Pancreatic cancer Neg Hx    Rectal cancer Neg Hx     Stomach cancer Neg Hx     Social History   Socioeconomic History   Marital status: Married    Spouse name: Not on file   Number of children: Not on file   Years of education: Not on file   Highest education level: Not on file  Occupational History   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, [employer]  Tobacco Use   Smoking status: Former    Types: Cigarettes    Quit date: 08/11/2009    Years since quitting: 11.9   Smokeless tobacco: Never  Substance and Sexual Activity   Alcohol use: Yes    Comment: extrememly rare about twice yearly   Drug use: No   Sexual activity: Not on file  Other Topics Concern   Not on file  Social History Narrative   Married 1/15   Social Determinants of Health   Financial Resource Strain: Not on file  Food Insecurity: Not on file  Transportation Needs: Not on file  Physical Activity: Not on file  Stress: Not on file  Social Connections: Not on file  Intimate Partner Violence: Not on file    Review of Systems  Constitutional:  Negative for fatigue and unexpected weight change.       No regular exercise---still not  back to the gym Wears seat belt  HENT:  Negative for dental problem, hearing loss, tinnitus and trouble swallowing.        Keeps up with dentist  Eyes:  Negative for visual disturbance.       No diplopia or unilateral vision loss  Respiratory:  Negative for cough and chest tightness.   Cardiovascular:  Positive for chest pain and palpitations. Negative for leg swelling.       Stamina is not as good---gets easier DOE still  Gastrointestinal:  Positive for blood in stool. Negative for constipation.       No heartburn on medication daily  Endocrine: Negative for polyuria.  Genitourinary:  Negative for difficulty urinating and urgency.       No sexual problems  Musculoskeletal:  Positive for back pain and neck pain. Negative for arthralgias and joint swelling.  Skin:  Negative for rash.       Does  see derm every 6 months  Allergic/Immunologic: Positive for environmental allergies. Negative for immunocompromised state.       Slight rhinorrhea--no meds  Neurological:  Negative for syncope, light-headedness and headaches.       Some dizziness if he bends over and gets up quick  Hematological:  Negative for adenopathy. Does not bruise/bleed easily.  Psychiatric/Behavioral:  Negative for dysphoric mood and sleep disturbance. The patient is not nervous/anxious.       Objective:   Physical Exam Constitutional:      Appearance: Normal appearance.  HENT:     Right Ear: Tympanic membrane and ear canal normal.     Left Ear: Tympanic membrane and ear canal normal.     Mouth/Throat:     Pharynx: No oropharyngeal exudate or posterior oropharyngeal erythema.  Eyes:     Conjunctiva/sclera: Conjunctivae normal.     Pupils: Pupils are equal, round, and reactive to light.  Cardiovascular:     Rate and Rhythm: Normal rate and regular rhythm.     Pulses: Normal pulses.     Heart sounds: No murmur heard.   No gallop.  Pulmonary:     Effort: Pulmonary effort is normal.     Breath sounds: Normal breath sounds. No wheezing or rales.  Abdominal:     Palpations: Abdomen is soft.     Tenderness: There is no abdominal tenderness.  Musculoskeletal:     Cervical back: Neck supple.     Right lower leg: No edema.     Left lower leg: No edema.  Lymphadenopathy:     Cervical: No cervical adenopathy.  Skin:    General: Skin is warm.     Findings: No rash.  Neurological:     General: No focal deficit present.     Mental Status: He is alert and oriented to person, place, and time.  Psychiatric:        Mood and Affect: Mood normal.        Behavior: Behavior normal.           Assessment & Plan:

## 2021-07-06 NOTE — Telephone Encounter (Signed)
Harold Li can you look at this please.  I see the recall entered and a notation that the letter was sent but I can not see the letter.

## 2021-07-06 NOTE — Addendum Note (Signed)
Addended by: Pilar Grammes on: 07/06/2021 10:50 AM   Modules accepted: Orders

## 2021-07-06 NOTE — Assessment & Plan Note (Signed)
Generally healthy Due for colon--contacted Dr Ardis Hughs about this Defer PSA to next year Flu vaccine today Needs bivalent COVID Consider shingrix

## 2021-07-06 NOTE — Assessment & Plan Note (Signed)
Okay on daily PPI

## 2021-07-06 NOTE — Assessment & Plan Note (Signed)
Clear change in stamina Past smoker but quit 12 years ago Uses respirator for painting If ongoing issues (can't improve stamina with exercise), will set up with pulmonary

## 2021-07-06 NOTE — Telephone Encounter (Signed)
The pt has been scheduled for 12/16 previsit and 12/23 colon.  FYI Dr Ardis Hughs the recall was entered and notation states letter was mailed.  I do not see a letter in the system. I have sent a message to [facility].

## 2021-07-06 NOTE — Telephone Encounter (Signed)
-----   Message from Milus Banister, MD sent at 07/06/2021  9:29 AM EDT ----- Will do. thanks  Braxley Balandran, He has h/o adenomatous polyps, should have had recall letter sent to him 2-3 months ago about it. Not sure what happened.  He needs [facility] colonoscopy.  Sheri, Not sure who to send this to.  I am very up to date on my recall letters.  Not sure what happened here. Can someone look into this?  Thanks   ----- Message ----- From: Venia Carbon, MD Sent: 07/06/2021   9:17 AM EDT To: Milus Banister, MD  Dan, He is due for his follow up colon--and just had blood in his stool. Can you get him set up? Rich

## 2021-08-18 DIAGNOSIS — E669 Obesity, unspecified: Secondary | ICD-10-CM | POA: Diagnosis not present

## 2021-08-18 DIAGNOSIS — I10 Essential (primary) hypertension: Secondary | ICD-10-CM | POA: Diagnosis not present

## 2021-08-18 DIAGNOSIS — E782 Mixed hyperlipidemia: Secondary | ICD-10-CM | POA: Diagnosis not present

## 2021-08-18 DIAGNOSIS — R9431 Abnormal electrocardiogram [ECG] [EKG]: Secondary | ICD-10-CM | POA: Diagnosis not present

## 2021-08-20 DIAGNOSIS — L570 Actinic keratosis: Secondary | ICD-10-CM | POA: Diagnosis not present

## 2021-08-26 ENCOUNTER — Ambulatory Visit: Payer: BC Managed Care – PPO

## 2021-08-26 ENCOUNTER — Encounter: Payer: Self-pay | Admitting: Gastroenterology

## 2021-08-26 ENCOUNTER — Other Ambulatory Visit: Payer: Self-pay

## 2021-08-26 VITALS — Ht 67.5 in | Wt 209.0 lb

## 2021-08-26 DIAGNOSIS — Z86010 Personal history of colonic polyps: Secondary | ICD-10-CM

## 2021-08-26 MED ORDER — NA SULFATE-K SULFATE-MG SULF 17.5-3.13-1.6 GM/177ML PO SOLN: 1.0000 | Freq: Once | ORAL | 0 refills | Status: AC

## 2021-08-26 NOTE — Progress Notes (Signed)
No allergies to soy or egg Pt is not on blood thinners or diet pills Denies issues with sedation/intubation Denies atrial flutter/fib Denies constipation   Pt is aware of Covid safety and care partner requirements.      

## 2021-09-01 ENCOUNTER — Encounter: Payer: Self-pay | Admitting: Certified Registered Nurse Anesthetist

## 2021-09-02 ENCOUNTER — Encounter: Payer: Self-pay | Admitting: Gastroenterology

## 2021-09-02 ENCOUNTER — Ambulatory Visit (AMBULATORY_SURGERY_CENTER): Payer: BC Managed Care – PPO | Admitting: Gastroenterology

## 2021-09-02 VITALS — BP 118/85 | HR 71 | Temp 97.5°F | Resp 17 | Ht 67.5 in | Wt 209.0 lb

## 2021-09-02 DIAGNOSIS — Z1211 Encounter for screening for malignant neoplasm of colon: Secondary | ICD-10-CM | POA: Diagnosis not present

## 2021-09-02 DIAGNOSIS — Z86010 Personal history of colonic polyps: Secondary | ICD-10-CM | POA: Diagnosis not present

## 2021-09-02 MED ORDER — SODIUM CHLORIDE 0.9 % IV SOLN: 500.0000 mL | Freq: Once | INTRAVENOUS | Status: DC

## 2021-09-02 NOTE — Progress Notes (Signed)
HPI: This is a man with h/o polyps  Colonoscopy 03/2014 Dr. Ardis Hughs, single subCM adenoma removed   ROS: complete GI ROS as described in HPI, all other review negative.  Constitutional:  No unintentional weight loss   Past Medical History:  Diagnosis Date   Depression    History of nephrolithiasis    Dr. Rosana Hoes   Hyperlipidemia    Hypertension     Past Surgical History:  Procedure Laterality Date   COLONOSCOPY     PARTIAL NEPHRECTOMY  09/11/2009   for oncocytoma    Current Outpatient Medications  Medication Sig Dispense Refill   losartan-hydrochlorothiazide (HYZAAR) 50-12.5 MG tablet Take 1 tablet by mouth daily. 90 tablet 3   omeprazole (PRILOSEC) 20 MG capsule Take 1 capsule (20 mg total) by mouth daily. 90 capsule 3   Rosuvastatin Calcium 10 MG CPSP      acyclovir (ZOVIRAX) 200 MG capsule Take by mouth.     meloxicam (MOBIC) 15 MG tablet TAKE 1 TABLET (15 MG TOTAL) BY MOUTH DAILY AS NEEDED FOR PAIN. 90 tablet 1   Multiple Vitamin (MULTI-VITAMIN PO) Take by mouth.     Current Facility-Administered Medications  Medication Dose Route Frequency Provider Last Rate Last Admin   0.9 %  sodium chloride infusion  500 mL Intravenous Once Milus Banister, MD        Allergies as of 09/02/2021 - Review Complete 09/02/2021  Allergen Reaction Noted   Citalopram hydrobromide  02/28/2010    Family History  Problem Relation Age of Onset   Diabetes Mother    Arthritis Mother    Stroke Mother    Coronary artery disease Father    Arthritis Father    Stroke Father        in eye   Blindness Father    Colon cancer Neg Hx    Pancreatic cancer Neg Hx    Rectal cancer Neg Hx    Stomach cancer Neg Hx    Colon polyps Neg Hx    Esophageal cancer Neg Hx     Social History   Socioeconomic History   Marital status: Married    Spouse name: Not on file   Number of children: Not on file   Years of education: Not on file   Highest education level: Not on file  Occupational History    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, [employer]  Tobacco Use   Smoking status: Former    Types: Cigarettes    Quit date: 08/11/2009    Years since quitting: 12.0   Smokeless tobacco: Never  Vaping Use   Vaping Use: Never used  Substance and Sexual Activity   Alcohol use: Yes    Comment: extrememly rare about twice yearly   Drug use: No   Sexual activity: Not on file  Other Topics Concern   Not on file  Social History Narrative   Married 1/15   Social Determinants of Health   Financial Resource Strain: Not on file  Food Insecurity: Not on file  Transportation Needs: Not on file  Physical Activity: Not on file  Stress: Not on file  Social Connections: Not on file  Intimate Partner Violence: Not on file     Physical Exam: BP 126/77 (Patient Position: Sitting)    Pulse 76    Temp (!) 97.5 F (36.4 C)    Ht 5' 7.5" (1.715 m)    Wt 209 lb (94.8 kg)    SpO2 98%  BMI 32.25 kg/m  Constitutional: generally well-appearing Psychiatric: alert and oriented x3 Lungs: CTA bilaterally Heart: no MCR  Assessment and plan: 60 y.o. male with h/o polyps  Colonboscopy today  Care is appropriate for the ambulatory setting.  Owens Loffler, MD [facility] 09/02/2021, 10:16 AM

## 2021-09-02 NOTE — Progress Notes (Signed)
Report given to PACU, vss 

## 2021-09-02 NOTE — Op Note (Signed)
[place] Patient Name: Harold Li Procedure Date: 09/02/2021 10:24 AM MRN: [id number] Endoscopist: Milus Banister , MD Age: 60 Referring MD:  Date of Birth: 1961/06/30 Gender: Male Account #: [id number] Procedure:                Colonoscopy Indications:              High risk colon cancer surveillance: Personal                            history of colonic polyps; Colonoscopy 2015 Dr.                            Ardis Hughs found single subCM adenoma Medicines:                Monitored Anesthesia Care Procedure:                Pre-Anesthesia Assessment:                           - Prior to the procedure, a History and Physical                            was performed, and patient medications and                            allergies were reviewed. The patient's tolerance of                            previous anesthesia was also reviewed. The risks                            and benefits of the procedure and the sedation                            options and risks were discussed with the patient.                            All questions were answered, and informed consent                            was obtained. Prior Anticoagulants: The patient has                            taken no previous anticoagulant or antiplatelet                            agents. ASA Grade Assessment: II - A patient with                            mild systemic disease. After reviewing the risks                            and benefits, the patient was deemed in  satisfactory condition to undergo the procedure.                           After obtaining informed consent, the colonoscope                            was passed under direct vision. Throughout the                            procedure, the patient's blood pressure, pulse, and                            oxygen saturations were monitored continuously. The                            CF [id number] #[id number] was introduced  through the anus                            and advanced to the the cecum, identified by                            appendiceal orifice and ileocecal valve. The                            colonoscopy was performed without difficulty. The                            patient tolerated the procedure well. The quality                            of the bowel preparation was good. The ileocecal                            valve, appendiceal orifice, and rectum were                            photographed. Scope In: 10:31:13 AM Scope Out: 10:42:51 AM Scope Withdrawal Time: 0 hours 9 minutes 53 seconds  Total Procedure Duration: 0 hours 11 minutes 38 seconds  Findings:                 The entire examined colon appeared normal on direct                            and retroflexion views. Complications:            No immediate complications. Estimated blood loss:                            None. Estimated Blood Loss:     Estimated blood loss: none. Impression:               - The entire examined colon is normal on direct and                            retroflexion views.                           -  No polyps or cancers. Recommendation:           - Patient has a contact number available for                            emergencies. The signs and symptoms of potential                            delayed complications were discussed with the                            patient. Return to normal activities tomorrow.                            Written discharge instructions were provided to the                            patient.                           - Resume previous diet.                           - Continue present medications.                           - Repeat colonoscopy in 10 years for screening. Milus Banister, MD 09/02/2021 10:45:55 AM This report has been signed electronically.

## 2021-09-02 NOTE — Progress Notes (Signed)
Pt's states no medical or surgical changes since previsit or office visit.  ° °CHECK-IN-AM ° °V/S-CW °

## 2021-09-02 NOTE — Patient Instructions (Signed)
Resume previous diet and medications.  Repeat colonoscopy in 10 years.   YOU HAD AN ENDOSCOPIC PROCEDURE TODAY AT [facility]:   Refer to the procedure report that was given to you for any specific questions about what was found during the examination.  If the procedure report does not answer your questions, please call your gastroenterologist to clarify.  If you requested that your care partner not be given the details of your procedure findings, then the procedure report has been included in a sealed envelope for you to review at your convenience later.  YOU SHOULD EXPECT: Some feelings of bloating in the abdomen. Passage of more gas than usual.  Walking can help get rid of the air that was put into your GI tract during the procedure and reduce the bloating. If you had a lower endoscopy (such as a colonoscopy or flexible sigmoidoscopy) you may notice spotting of blood in your stool or on the toilet paper. If you underwent a bowel prep for your procedure, you may not have a normal bowel movement for a few days.  Please Note:  You might notice some irritation and congestion in your nose or some drainage.  This is from the oxygen used during your procedure.  There is no need for concern and it should clear up in a day or so.  SYMPTOMS TO REPORT IMMEDIATELY:  Following lower endoscopy (colonoscopy or flexible sigmoidoscopy):  Excessive amounts of blood in the stool  Significant tenderness or worsening of abdominal pains  Swelling of the abdomen that is new, acute  Fever of 100F or higher  For urgent or emergent issues, a gastroenterologist can be reached at any hour by calling [phone]. Do not use MyChart messaging for urgent concerns.    DIET:  We do recommend a small meal at first, but then you may proceed to your regular diet.  Drink plenty of fluids but you should avoid alcoholic beverages for 24 hours.  ACTIVITY:  You should plan to take it easy for the rest of  today and you should NOT DRIVE or use heavy machinery until tomorrow (because of the sedation medicines used during the test).    FOLLOW UP: Our staff will call the number listed on your records 48-72 hours following your procedure to check on you and address any questions or concerns that you may have regarding the information given to you following your procedure. If we do not reach you, we will leave a message.  We will attempt to reach you two times.  During this call, we will ask if you have developed any symptoms of COVID 19. If you develop any symptoms (ie: fever, flu-like symptoms, shortness of breath, cough etc.) before then, please call [phone].  If you test positive for Covid 19 in the 2 weeks post procedure, please call and report this information to [place].    If any biopsies were taken you will be contacted by phone or by letter within the next 1-3 weeks.  Please call us at [phone] if you have not heard about the biopsies in 3 weeks.    SIGNATURES/CONFIDENTIALITY: You and/or your care partner have signed paperwork which will be entered into your electronic medical record.  These signatures attest to the fact that that the information above on your After Visit Summary has been reviewed and is understood.  Full responsibility of the confidentiality of this discharge information lies with you and/or your care-partner.

## 2021-09-07 ENCOUNTER — Telehealth: Payer: Self-pay | Admitting: *Deleted

## 2021-09-07 NOTE — Telephone Encounter (Signed)
°  Follow up Call-  Call back number 09/02/2021  Post procedure Call Back phone  # [phone]  Permission to leave phone message Yes  Some recent data might be hidden     Patient questions:  Do you have a fever, pain , or abdominal swelling? No. Pain Score  0 *  Have you tolerated food without any problems? Yes.    Have you been able to return to your normal activities? Yes.    Do you have any questions about your discharge instructions: Diet   No. Medications  No. Follow up visit  No.  Do you have questions or concerns about your Care? No.  Actions: * If pain score is 4 or above: No action needed, pain <4.

## 2021-10-12 DIAGNOSIS — E782 Mixed hyperlipidemia: Secondary | ICD-10-CM | POA: Diagnosis not present

## 2021-10-20 DIAGNOSIS — I10 Essential (primary) hypertension: Secondary | ICD-10-CM | POA: Diagnosis not present

## 2021-10-20 DIAGNOSIS — E669 Obesity, unspecified: Secondary | ICD-10-CM | POA: Diagnosis not present

## 2021-10-20 DIAGNOSIS — E782 Mixed hyperlipidemia: Secondary | ICD-10-CM | POA: Diagnosis not present

## 2021-10-20 DIAGNOSIS — K219 Gastro-esophageal reflux disease without esophagitis: Secondary | ICD-10-CM | POA: Diagnosis not present

## 2022-02-15 ENCOUNTER — Other Ambulatory Visit: Payer: Self-pay | Admitting: Internal Medicine

## 2022-02-21 DIAGNOSIS — L821 Other seborrheic keratosis: Secondary | ICD-10-CM | POA: Diagnosis not present

## 2022-02-21 DIAGNOSIS — L570 Actinic keratosis: Secondary | ICD-10-CM | POA: Diagnosis not present

## 2022-02-21 DIAGNOSIS — L578 Other skin changes due to chronic exposure to nonionizing radiation: Secondary | ICD-10-CM | POA: Diagnosis not present

## 2022-04-20 DIAGNOSIS — K219 Gastro-esophageal reflux disease without esophagitis: Secondary | ICD-10-CM | POA: Diagnosis not present

## 2022-04-20 DIAGNOSIS — I10 Essential (primary) hypertension: Secondary | ICD-10-CM | POA: Diagnosis not present

## 2022-04-20 DIAGNOSIS — R9431 Abnormal electrocardiogram [ECG] [EKG]: Secondary | ICD-10-CM | POA: Diagnosis not present

## 2022-04-20 DIAGNOSIS — E782 Mixed hyperlipidemia: Secondary | ICD-10-CM | POA: Diagnosis not present

## 2022-06-27 DIAGNOSIS — J029 Acute pharyngitis, unspecified: Secondary | ICD-10-CM | POA: Diagnosis not present

## 2022-06-27 DIAGNOSIS — R051 Acute cough: Secondary | ICD-10-CM | POA: Diagnosis not present

## 2022-06-27 DIAGNOSIS — R52 Pain, unspecified: Secondary | ICD-10-CM | POA: Diagnosis not present

## 2022-06-27 DIAGNOSIS — Z6827 Body mass index (BMI) 27.0-27.9, adult: Secondary | ICD-10-CM | POA: Diagnosis not present

## 2022-07-06 DIAGNOSIS — L570 Actinic keratosis: Secondary | ICD-10-CM | POA: Diagnosis not present

## 2022-07-10 ENCOUNTER — Encounter: Payer: Self-pay | Admitting: Internal Medicine

## 2022-07-10 ENCOUNTER — Ambulatory Visit (INDEPENDENT_AMBULATORY_CARE_PROVIDER_SITE_OTHER): Payer: BC Managed Care – PPO | Admitting: Internal Medicine

## 2022-07-10 VITALS — BP 114/64 | HR 76 | Temp 97.9°F | Ht 67.0 in | Wt 186.0 lb

## 2022-07-10 DIAGNOSIS — I10 Essential (primary) hypertension: Secondary | ICD-10-CM | POA: Diagnosis not present

## 2022-07-10 DIAGNOSIS — Z125 Encounter for screening for malignant neoplasm of prostate: Secondary | ICD-10-CM | POA: Diagnosis not present

## 2022-07-10 DIAGNOSIS — E785 Hyperlipidemia, unspecified: Secondary | ICD-10-CM

## 2022-07-10 DIAGNOSIS — Z0000 Encounter for general adult medical examination without abnormal findings: Secondary | ICD-10-CM

## 2022-07-10 DIAGNOSIS — Z23 Encounter for immunization: Secondary | ICD-10-CM

## 2022-07-10 DIAGNOSIS — K219 Gastro-esophageal reflux disease without esophagitis: Secondary | ICD-10-CM

## 2022-07-10 DIAGNOSIS — M159 Polyosteoarthritis, unspecified: Secondary | ICD-10-CM

## 2022-07-10 NOTE — Addendum Note (Signed)
Addended by: Pilar Grammes on: 07/10/2022 05:19 PM   Modules accepted: Orders

## 2022-07-10 NOTE — Assessment & Plan Note (Signed)
Better lately Hasn't needed meds

## 2022-07-10 NOTE — Assessment & Plan Note (Signed)
Mostly back Monthly chiropractic Rare meloxicam

## 2022-07-10 NOTE — Progress Notes (Signed)
Subjective:    Patient ID: Harold Li, male    DOB: 1961-08-09, 61 y.o.   MRN: [id number]  HPI Here for physical  Was sick a couple of weeks ago Recovered from that---though voice off a bit still [place] when [place] worsened--got prednisone and z-pak  Current Outpatient Medications on File Prior to Visit  Medication Sig Dispense Refill   acyclovir (ZOVIRAX) 200 MG capsule Take by mouth.     losartan-hydrochlorothiazide (HYZAAR) 50-12.5 MG tablet TAKE 1 TABLET BY MOUTH EVERY DAY 90 tablet 3   meloxicam (MOBIC) 15 MG tablet TAKE 1 TABLET (15 MG TOTAL) BY MOUTH DAILY AS NEEDED FOR PAIN. 90 tablet 1   Multiple Vitamin (MULTI-VITAMIN PO) Take by mouth.     omeprazole (PRILOSEC) 20 MG capsule Take 1 capsule (20 mg total) by mouth daily. 90 capsule 3   Rosuvastatin Calcium 10 MG CPSP      No current facility-administered medications on file prior to visit.    Allergies  Allergen Reactions   Citalopram Hydrobromide     REACTION: no energy, weak    Past Medical History:  Diagnosis Date   Depression    History of nephrolithiasis    Dr. Rosana Hoes   Hyperlipidemia    Hypertension     Past Surgical History:  Procedure Laterality Date   COLONOSCOPY     PARTIAL NEPHRECTOMY  09/11/2009   for oncocytoma    Family History  Problem Relation Age of Onset   Diabetes Mother    Arthritis Mother    Stroke Mother    Coronary artery disease Father    Arthritis Father    Stroke Father        in eye   Blindness Father    Colon cancer Neg Hx    Pancreatic cancer Neg Hx    Rectal cancer Neg Hx    Stomach cancer Neg Hx    Colon polyps Neg Hx    Esophageal cancer Neg Hx     Social History   Socioeconomic History   Marital status: Married    Spouse name: Not on file   Number of children: Not on file   Years of education: Not on file   Highest education level: Not on file  Occupational History   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  Tobacco Use   Smoking status: Former    Types: Cigarettes    Quit date: 08/11/2009    Years since quitting: 12.9   Smokeless tobacco: Never  Vaping Use   Vaping Use: Never used  Substance and Sexual Activity   Alcohol use: Yes    Comment: extrememly rare about twice yearly   Drug use: No   Sexual activity: Not on file  Other Topics Concern   Not on file  Social History Narrative   Married 1/15   Social Determinants of Health   Financial Resource Strain: Not on file  Food Insecurity: Not on file  Transportation Needs: Not on file  Physical Activity: Not on file  Stress: Not on file  Social Connections: Not on file  Intimate Partner Violence: Not on file   Review of Systems  Constitutional:  Negative for fatigue.       Walks 3 miles a day Has lost 20# Wears seat belt  HENT:  Negative for dental problem, hearing loss and tinnitus.        Keeps up with dentist  Eyes:  No diplopia or unilateral vision loss Some distance blurring  Respiratory:  Negative for cough, chest tightness and shortness of breath.   Cardiovascular:  Negative for chest pain and leg swelling.       Does feel heart beat fast sometimes at rest---seconds   Gastrointestinal:  Negative for constipation.       Rare blood on toilet paper/bowl No heartburn--or infrequent. Rare dysphagia  Endocrine: Negative for polydipsia and polyuria.  Genitourinary:  Negative for difficulty urinating and urgency.       No sexual problems  Musculoskeletal:  Negative for arthralgias and joint swelling.       Back goes out at times--- chiropractor will help acutely (and has monthly routine visit)  Skin:  Negative for rash.       keeps up with derm--cryotherapy both liquid nitrogen and cream  Allergic/Immunologic: Negative for environmental allergies and immunocompromised state.  Neurological:  Negative for speech difficulty and headaches.       Slight dizziness with weight loss--now better   Hematological:  Negative for adenopathy.          Psychiatric/Behavioral:  Negative for dysphoric mood and sleep disturbance. The patient is not nervous/anxious.        Objective:   Physical Exam Constitutional:      Appearance: Normal appearance.  HENT:     Mouth/Throat:     Pharynx: No oropharyngeal exudate or posterior oropharyngeal erythema.  Eyes:     Conjunctiva/sclera: Conjunctivae normal.     Pupils: Pupils are equal, round, and reactive to light.  Cardiovascular:     Rate and Rhythm: Normal rate and regular rhythm.     Pulses: Normal pulses.     Heart sounds: No murmur heard.    No gallop.  Pulmonary:     Effort: Pulmonary effort is normal.     Breath sounds: Normal breath sounds. No wheezing or rales.  Abdominal:     Palpations: Abdomen is soft.     Tenderness: There is no abdominal tenderness.  Musculoskeletal:     Cervical back: Neck supple.     Right lower leg: No edema.     Left lower leg: No edema.  Lymphadenopathy:     Cervical: No cervical adenopathy.  Skin:    Findings: No rash.     Comments: Recovering areas on arms from cryotherapy  Neurological:     General: No focal deficit present.     Mental Status: He is alert and oriented to person, place, and time.  Psychiatric:        Mood and Affect: Mood normal.        Behavior: Behavior normal.            Assessment & Plan:

## 2022-07-10 NOTE — Assessment & Plan Note (Signed)
On statin now

## 2022-07-10 NOTE — Assessment & Plan Note (Signed)
Healthy Has worked hard on fitness Flu and shingrix today Prefers no COVID vaccine Will check PSA Colon due again 2032

## 2022-07-10 NOTE — Assessment & Plan Note (Signed)
BP Readings from Last 3 Encounters:  07/10/22 114/64  09/02/21 118/85  07/06/21 130/84   Well controlled on losartan/HCTZ 50/12.5

## 2022-07-11 LAB — COMPREHENSIVE METABOLIC PANEL
ALT: 40 U/L (ref 0–53)
AST: 32 U/L (ref 0–37)
Albumin: 4 g/dL (ref 3.5–5.2)
Alkaline Phosphatase: 68 U/L (ref 39–117)
BUN: 24 mg/dL — ABNORMAL HIGH (ref 6–23)
CO2: 29 mEq/L (ref 19–32)
Calcium: 9.2 mg/dL (ref 8.4–10.5)
Chloride: 105 mEq/L (ref 96–112)
Creatinine, Ser: 1.38 mg/dL (ref 0.40–1.50)
GFR: 54.44 mL/min — ABNORMAL LOW (ref >60.00)
Glucose, Bld: 76 mg/dL (ref 70–99)
Potassium: 3.7 mEq/L (ref 3.5–5.1)
Sodium: 141 mEq/L (ref 135–145)
Total Bilirubin: 0.3 mg/dL (ref 0.2–1.2)
Total Protein: 6.3 g/dL (ref 6.0–8.3)

## 2022-07-11 LAB — CBC
HCT: 40.5 % (ref 39.0–52.0)
Hemoglobin: 13.3 g/dL (ref 13.0–17.0)
MCHC: 32.8 g/dL (ref 30.0–36.0)
MCV: 92.2 fl (ref 78.0–100.0)
Platelets: 279 10*3/uL (ref 150.0–400.0)
RBC: 4.39 Mil/uL (ref 4.22–5.81)
RDW: 12.7 % (ref 11.5–15.5)
WBC: 7.4 10*3/uL (ref 4.0–10.5)

## 2022-07-11 LAB — LIPID PANEL
Cholesterol: 152 mg/dL (ref 0–200)
HDL: 50.6 mg/dL (ref >39.00)
LDL Cholesterol: 81 mg/dL (ref 0–99)
NonHDL: 101.64
Total CHOL/HDL Ratio: 3
Triglycerides: 103 mg/dL (ref 0.0–149.0)
VLDL: 20.6 mg/dL (ref 0.0–40.0)

## 2022-07-11 LAB — PSA: PSA: 0.49 ng/mL (ref 0.10–4.00)

## 2022-11-01 ENCOUNTER — Other Ambulatory Visit: Payer: Self-pay | Admitting: Cardiovascular Disease

## 2022-11-01 DIAGNOSIS — E782 Mixed hyperlipidemia: Secondary | ICD-10-CM

## 2022-11-20 ENCOUNTER — Encounter: Payer: Self-pay | Admitting: Cardiovascular Disease

## 2022-11-20 ENCOUNTER — Ambulatory Visit: Payer: Managed Care, Other (non HMO) | Admitting: Cardiovascular Disease

## 2022-11-20 VITALS — BP 120/70 | HR 77 | Ht 67.0 in | Wt 193.4 lb

## 2022-11-20 DIAGNOSIS — E782 Mixed hyperlipidemia: Secondary | ICD-10-CM

## 2022-11-20 DIAGNOSIS — I10 Essential (primary) hypertension: Secondary | ICD-10-CM

## 2022-11-20 NOTE — Progress Notes (Signed)
Erroneous error

## 2022-11-20 NOTE — Progress Notes (Signed)
Left voice mail for him to call back to come in at 2 instead of 3:45.

## 2022-11-20 NOTE — Progress Notes (Signed)
Cardiology Office Note   Date:  11/20/2022   ID:  Harold Li [birth year], MRN [id number]  PCP:  Venia Carbon, MD  Cardiologist:  Neoma Laming, MD      History of Present Illness: Harold Li is a 62 y.o. male who presents for  Chief Complaint  Patient presents with   Follow-up    6 month follow up    Patient in office for routine cardiac exam. Denies chest pain, shortness of breath, edema, palpitations.      Past Medical History:  Diagnosis Date   Depression    History of nephrolithiasis    Dr. Rosana Hoes   Hyperlipidemia    Hypertension      Past Surgical History:  Procedure Laterality Date   COLONOSCOPY     PARTIAL NEPHRECTOMY  09/11/2009   for oncocytoma     Current Outpatient Medications  Medication Sig Dispense Refill   acyclovir (ZOVIRAX) 200 MG capsule Take by mouth.     losartan-hydrochlorothiazide (HYZAAR) 50-12.5 MG tablet TAKE 1 TABLET BY MOUTH EVERY DAY 90 tablet 3   meloxicam (MOBIC) 15 MG tablet TAKE 1 TABLET (15 MG TOTAL) BY MOUTH DAILY AS NEEDED FOR PAIN. 90 tablet 1   Multiple Vitamin (MULTI-VITAMIN PO) Take by mouth.     rosuvastatin (CRESTOR) 10 MG tablet TAKE 1 TABLET BY MOUTH EVERY DAY 90 tablet 0   No current facility-administered medications for this visit.    Allergies:   Citalopram hydrobromide    Social History:   reports that he quit smoking about 13 years ago. His smoking use included cigarettes. He has never used smokeless tobacco. He reports current alcohol use. He reports that he does not use drugs.   Family History:  family history includes Arthritis in his father and mother; Blindness in his father; Coronary artery disease in his father; Diabetes in his mother; Stroke in his father and mother.    ROS:     Review of Systems  Constitutional: Negative.   HENT: Negative.    Eyes: Negative.   Respiratory: Negative.    Cardiovascular: Negative.   Gastrointestinal: Negative.   Genitourinary: Negative.    Musculoskeletal: Negative.   Skin: Negative.   Neurological: Negative.   Endo/Heme/Allergies: Negative.   Psychiatric/Behavioral: Negative.    All other systems reviewed and are negative.   All other systems are reviewed and negative.    PHYSICAL EXAM: VS:  BP 120/70   Pulse 77   Ht '5\' 7"'$  (1.702 m)   Wt 193 lb 6.4 oz (87.7 kg)   SpO2 96%   BMI 30.29 kg/m  , BMI Body mass index is 30.29 kg/m. Last weight:  Wt Readings from Last 3 Encounters:  11/20/22 193 lb 6.4 oz (87.7 kg)  07/10/22 186 lb (84.4 kg)  09/02/21 209 lb (94.8 kg)     Physical Exam Vitals reviewed.  Constitutional:      Appearance: Normal appearance. He is normal weight.  HENT:     Head: Normocephalic.     Nose: Nose normal.     Mouth/Throat:     Mouth: Mucous membranes are moist.  Eyes:     Pupils: Pupils are equal, round, and reactive to light.  Cardiovascular:     Rate and Rhythm: Normal rate and regular rhythm.     Pulses: Normal pulses.     Heart sounds: Normal heart sounds.  Pulmonary:     Effort: Pulmonary effort is normal.  Abdominal:  General: Abdomen is flat. Bowel sounds are normal.  Musculoskeletal:        General: Normal range of motion.     Cervical back: Normal range of motion.  Skin:    General: Skin is warm.  Neurological:     General: No focal deficit present.     Mental Status: He is alert.  Psychiatric:        Mood and Affect: Mood normal.     EKG: none today  Recent Labs: 07/10/2022: ALT 40; BUN 24; Creatinine, Ser 1.38; Hemoglobin 13.3; Platelets 279.0; Potassium 3.7; Sodium 141    Lipid Panel    Component Value Date/Time   CHOL 152 07/10/2022 1607   TRIG 103.0 07/10/2022 1607   HDL 50.60 07/10/2022 1607   CHOLHDL 3 07/10/2022 1607   VLDL 20.6 07/10/2022 1607   LDLCALC 81 07/10/2022 1607   LDLDIRECT 164.5 06/02/2011 0824      Other studies Reviewed: Patient: [id number] - Harold Li DOB:  06-26-1961  Date:  05/13/2021 11:15 Provider: Neoma Laming  MD Encounter: ECHO   Page 2 REASON FOR VISIT  Visit for: Echocardiogram/ Chest pain unspecified  Sex:   Male     wt=  198  lbs.  BP= 118/74  Height=  68  inches.    TESTS  Imaging: Echocardiogram:  An echocardiogram in (2-d) mode was performed and in Doppler mode with color flow velocity mapping was performed. The aortic valve cusps are abnormal 2.3  cm, flow velocity 1.5  m/s, and systolic calculated mean flow gradient 4  mmHg. Mitral valve diastolic peak flow velocity E 0.6   m/s and E/A ratio 1. Aortic root diameter 3.4   cm. The LVOT internal diameter 1.7  cm and flow velocity was abnormal 1.1   m/s. LV systolic dimension 2.8  cm, diastolic 4.5 cm, posterior wall thickness 1   cm, fractional shortening 35 %, and EF 55 %. IVS thickness 1   cm. LA dimension 3 cm  RIGHT atrium=  18.6  cm2. Mitral Valve =  Ea= 7.3  DT= 261 msec. Mitral Valve is Normal. Tricuspid Valve =  TR jet V=  2.3    RAP= 5  RVSP= 26  mmHg. Aortic Valve is Normal. Pulmonic Valve is Normal. Tricuspid Valve has Mild Regurgitation.     ASSESSMENT  Technically adequate study.  Ejection fraction-55  Left Ventricle- Normal size   Left Ventricle diastolic dysfunction grade-Grade 1 relaxation abnormality  Right Ventricle- Mildly dilated  Normal right ventricular wall motion  Left Atrium-Normal size  Right Atrium-Mildly dilated  Aortic valve-Trivial Aortic valve calcification with No stenosis, No regurgitation  Pulmonic Valve-No stenosis, no regurgitation  Mitral Valve-No regurgitation  Tricuspid Valve-Mild Regurgitation  No Pericardial effusion.   THERAPY   Referring physician: Dionisio David  Sonographer: Ruta Hinds.   Neoma Laming MD  Electronically signed by: Neoma Laming     Date: 05/13/2021 15:24  Patient: [id number] - Harold Li DOB:  1960-11-12  Date:  05/26/2021 07:30 Provider: Neoma Laming MD Encounter: NUCLEAR STRESS TEST   Page 1 TESTS  [facility] [address] [place], [place] [address] [phone] STUDY:  Gated Stress / Rest Myocardial Perfusion Imaging Tomographic (SPECT) Including attenuation correction Wall Motion, Left Ventricular Ejection Fraction By Gated Technique.Treadmill Stress Test. SEX: Male   WEIGHT: 195 lbs  HEIGHT: 68 in      ARMS UP: YES/NO                                                                        REFERRING PHYSICIAN: Dr.Zaara Sprowl Humphrey Rolls                                                                                                                                                                                                                       INDICATION FOR STUDY: CP                                                                                                                                                                                                                    TECHNIQUE:  Approximately 20 minutes following the intravenous administration of 10.4 mCi of Tc-51mSestamibi after stress testing in a reclined supine position with arms above their head if able to do so, gated SPECT imaging of the heart was  performed. After about a 2hr break, the patient was injected intravenously with 32.5 mCi of Tc-38mSestamibi.  Approximately 45 minutes later in the same position as stress imaging SPECT rest imaging of the heart was performed.  STRESS BY:  SNeoma Laming MD PROTOCOL:   BDarnell Level                                                                                      MAX PRED HR: 158                     85%: 134               75%: 119                                                                                                                   RESTING BP: 130/82   RESTING HR: 87  PEAK BP: 160/84   PEAK HR: 131 (82%)                                                                    EXERCISE DURATION: 6:40                                              METS: 8.0     REASON FOR TEST TERMINATION: Fatigue. SOB.                                                                                                                                  SYMPTOMS: Fatigue. SOB.  [place]KE TREADMILL SCORE: -3  RISK: Moderate                                                                                                                                                                                                            EKG RESULTS: NSR. 86/min. 74m ST depression inferolaterally in recovery.                                                              IMAGE QUALITY: Good                                                                                                                                                                                                                                                                                                                                 PERFUSION/WALL  MOTION FINDINGS: EF = 67%. No perfusion defects, normal wall motion.                                                                          IMPRESSION: Discordant findings between EKG and nuclear imaging, correlate clinically.                                                                                                                                                                                                                                                                                         Neoma Laming, MD Stress Interpreting Physician / Nuclear Interpreting Physician        Neoma Laming MD  Electronically signed by: Neoma Laming     Date: 05/30/2021 11:11  Patient: [id number] - Harold Li DOB:  1961/02/12  Date:  06/14/2021 11:00 Provider: Neoma Laming MD Encounter: ALL ANGIOGRAMS (CTA BRAIN, CAROTIDS, RENAL ARTERIES, PE)   Page 2 REASON FOR VISIT  Referred by Dr.Kage Willmann Humphrey Rolls.    TESTS   Imaging: Computed Tomographic Angiography:  Cardiac multidetector CT was performed paying particular attention to the coronary arteries for the diagnosis of: Abnormal EKG. Diagnostic Drugs:  Administered iohexol (Omnipaque) through an antecubital vein and images from the examination were analyzed for the presence and extent of coronary artery disease, using 3D image processing software. 100 mL of non-ionic contrast (Omnipaque) was used.   TEST CONCLUSIONS  Qualtiy of study: Excellent  1-Calcium score: 31.3  2-Right dominant system.  3-Normal coronaries.   Neoma Laming MD  Electronically signed by: Neoma Laming     Date: 06/15/2021 09:33   ASSESSMENT AND PLAN:    ICD-10-CM   1. Benign essential HTN  I10 CMP14+EGFR    2. Mixed hyperlipidemia  E78.2 Lipid Profile       Problem List Items Addressed This Visit       Cardiovascular and  Mediastinum   Benign essential HTN - Primary    Well controlled on Hyzaar. Continue same dose.       Relevant Orders   CMP14+EGFR     Other   Hyperlipidemia    LDL 133 06/2022 on rosuvastatin 10 mg daily. Calcium score 31 on CCTA 06/2021 with normal coronaries. Will repeat blood work to check LDL. May need to increase rosuvastatin if LDL over 100.      Relevant Orders   Lipid Profile       Disposition:   Return in about 6 months (around 05/23/2023).    Total time spent: 30 minutes  Signed,  Neoma Laming, MD  11/20/2022 3:50 [place]

## 2022-11-20 NOTE — Assessment & Plan Note (Signed)
LDL 133 06/2022 on rosuvastatin 10 mg daily. Calcium score 31 on CCTA 06/2021 with normal coronaries. Will repeat blood work to check LDL. May need to increase rosuvastatin if LDL over 100.

## 2022-11-20 NOTE — Assessment & Plan Note (Signed)
Well controlled on Hyzaar. Continue same dose.

## 2022-11-23 ENCOUNTER — Other Ambulatory Visit: Payer: Managed Care, Other (non HMO)

## 2022-11-24 LAB — CMP14+EGFR
ALT: 43 IU/L (ref 0–44)
AST: 41 IU/L — ABNORMAL HIGH (ref 0–40)
Albumin/Globulin Ratio: 1.8 (ref 1.2–2.2)
Albumin: 4.4 g/dL (ref 3.9–4.9)
Alkaline Phosphatase: 77 IU/L (ref 44–121)
BUN/Creatinine Ratio: 13 (ref 10–24)
BUN: 18 mg/dL (ref 8–27)
Bilirubin Total: 0.4 mg/dL (ref 0.0–1.2)
CO2: 25 mmol/L (ref 20–29)
Calcium: 9.4 mg/dL (ref 8.6–10.2)
Chloride: 106 mmol/L (ref 96–106)
Creatinine, Ser: 1.39 mg/dL — ABNORMAL HIGH (ref 0.76–1.27)
Globulin, Total: 2.5 g/dL (ref 1.5–4.5)
Glucose: 90 mg/dL (ref 70–99)
Potassium: 4.6 mmol/L (ref 3.5–5.2)
Sodium: 145 mmol/L — ABNORMAL HIGH (ref 134–144)
Total Protein: 6.9 g/dL (ref 6.0–8.5)
eGFR: 57 mL/min/{1.73_m2} — ABNORMAL LOW (ref >59)

## 2022-11-24 LAB — LIPID PANEL
Chol/HDL Ratio: 2.4 ratio (ref 0.0–5.0)
Cholesterol, Total: 170 mg/dL (ref 100–199)
HDL: 70 mg/dL (ref >39)
LDL Chol Calc (NIH): 89 mg/dL (ref 0–99)
Triglycerides: 57 mg/dL (ref 0–149)
VLDL Cholesterol Cal: 11 mg/dL (ref 5–40)

## 2022-11-27 ENCOUNTER — Telehealth: Payer: Self-pay | Admitting: Internal Medicine

## 2022-11-27 DIAGNOSIS — N1831 Chronic kidney disease, stage 3a: Secondary | ICD-10-CM

## 2022-11-27 NOTE — Telephone Encounter (Signed)
   Reason for Referral Request: Please note labs on 3/14  Has patient been seen PCP for this complaint? Yes  No,  please schedule patient for appointment for complaint.  Patient scheduled on: n/a  Yes, please find out following information.  Referral for which specialty: Urology  Preferred office/provider: [facility]

## 2022-11-27 NOTE — Telephone Encounter (Signed)
Spoke to pt. He does want to move forward with a nephrologist. [facility] or [place]. Whoever is available the quickest. He has not been on meloxicam in weeks.

## 2022-11-28 ENCOUNTER — Encounter: Payer: Self-pay | Admitting: *Deleted

## 2022-12-13 ENCOUNTER — Ambulatory Visit (INDEPENDENT_AMBULATORY_CARE_PROVIDER_SITE_OTHER): Payer: Managed Care, Other (non HMO)

## 2022-12-13 DIAGNOSIS — Z23 Encounter for immunization: Secondary | ICD-10-CM

## 2022-12-13 NOTE — Progress Notes (Signed)
After obtaining consent, and per orders of Dr. Silvio Pate, 2nd vaccine of Shingrix  given by Ferne Reus in pt's left deltoid IM.   Pt tolerated injection well.

## 2023-01-22 LAB — LAB REPORT - SCANNED: EGFR: 60

## 2023-01-31 ENCOUNTER — Other Ambulatory Visit: Payer: Self-pay | Admitting: Cardiovascular Disease

## 2023-01-31 DIAGNOSIS — E782 Mixed hyperlipidemia: Secondary | ICD-10-CM

## 2023-02-08 ENCOUNTER — Other Ambulatory Visit: Payer: Self-pay | Admitting: Internal Medicine

## 2023-05-01 ENCOUNTER — Other Ambulatory Visit: Payer: Self-pay | Admitting: Cardiovascular Disease

## 2023-05-01 DIAGNOSIS — E782 Mixed hyperlipidemia: Secondary | ICD-10-CM

## 2023-05-24 ENCOUNTER — Ambulatory Visit: Payer: Managed Care, Other (non HMO) | Admitting: Cardiology

## 2023-06-01 ENCOUNTER — Ambulatory Visit: Payer: Managed Care, Other (non HMO) | Admitting: Cardiovascular Disease

## 2023-06-04 ENCOUNTER — Encounter: Payer: Self-pay | Admitting: Cardiovascular Disease

## 2023-06-04 ENCOUNTER — Ambulatory Visit: Payer: Managed Care, Other (non HMO) | Admitting: Cardiovascular Disease

## 2023-06-04 VITALS — BP 115/70 | HR 81 | Ht 67.0 in | Wt 190.6 lb

## 2023-06-04 DIAGNOSIS — I340 Nonrheumatic mitral (valve) insufficiency: Secondary | ICD-10-CM

## 2023-06-04 DIAGNOSIS — I10 Essential (primary) hypertension: Secondary | ICD-10-CM

## 2023-06-04 DIAGNOSIS — E785 Hyperlipidemia, unspecified: Secondary | ICD-10-CM

## 2023-06-04 DIAGNOSIS — R0609 Other forms of dyspnea: Secondary | ICD-10-CM | POA: Diagnosis not present

## 2023-06-04 NOTE — Progress Notes (Signed)
Cardiology Office Note   Date:  06/04/2023   ID:  Sergio, Beaver 1961-06-09, MRN [id number]  PCP:  Karie Schwalbe, MD  Cardiologist:  Adrian Blackwater, MD      History of Present Illness: Harold Li is a 62 y.o. male who presents for  Chief Complaint  Patient presents with   Follow-up    6 mo f/u    No symptoms      Past Medical History:  Diagnosis Date   Depression    History of nephrolithiasis    Dr. Earlene Plater   Hyperlipidemia    Hypertension      Past Surgical History:  Procedure Laterality Date   COLONOSCOPY     PARTIAL NEPHRECTOMY  09/11/2009   for oncocytoma     Current Outpatient Medications  Medication Sig Dispense Refill   acyclovir (ZOVIRAX) 200 MG capsule Take by mouth.     losartan-hydrochlorothiazide (HYZAAR) 50-12.5 MG tablet TAKE 1 TABLET BY MOUTH EVERY DAY 90 tablet 3   meloxicam (MOBIC) 15 MG tablet TAKE 1 TABLET (15 MG TOTAL) BY MOUTH DAILY AS NEEDED FOR PAIN. 90 tablet 1   Multiple Vitamin (MULTI-VITAMIN PO) Take by mouth.     rosuvastatin (CRESTOR) 10 MG tablet TAKE 1 TABLET BY MOUTH EVERY DAY 90 tablet 0   No current facility-administered medications for this visit.    Allergies:   Citalopram hydrobromide    Social History:   reports that he quit smoking about 13 years ago. His smoking use included cigarettes. He has never used smokeless tobacco. He reports current alcohol use. He reports that he does not use drugs.   Family History:  family history includes Arthritis in his father and mother; Blindness in his father; Coronary artery disease in his father; Diabetes in his mother; Stroke in his father and mother.    ROS:     Review of Systems  Constitutional: Negative.   HENT: Negative.    Eyes: Negative.   Respiratory: Negative.    Gastrointestinal: Negative.   Genitourinary: Negative.   Musculoskeletal: Negative.   Skin: Negative.   Neurological: Negative.   Endo/Heme/Allergies: Negative.   Psychiatric/Behavioral:  Negative.    All other systems reviewed and are negative.     All other systems are reviewed and negative.    PHYSICAL EXAM: VS:  BP 115/70   Pulse 81   Ht 5\' 7"  (1.702 m)   Wt 190 lb 9.6 oz (86.5 kg)   SpO2 98%   BMI 29.85 kg/m  , BMI Body mass index is 29.85 kg/m. Last weight:  Wt Readings from Last 3 Encounters:  06/04/23 190 lb 9.6 oz (86.5 kg)  11/20/22 193 lb 6.4 oz (87.7 kg)  07/10/22 186 lb (84.4 kg)     Physical Exam Vitals reviewed.  Constitutional:      Appearance: Normal appearance. He is normal weight.  HENT:     Head: Normocephalic.     Nose: Nose normal.     Mouth/Throat:     Mouth: Mucous membranes are moist.  Eyes:     Pupils: Pupils are equal, round, and reactive to light.  Cardiovascular:     Rate and Rhythm: Normal rate and regular rhythm.     Pulses: Normal pulses.     Heart sounds: Normal heart sounds.  Pulmonary:     Effort: Pulmonary effort is normal.  Abdominal:     General: Abdomen is flat. Bowel sounds are normal.  Musculoskeletal:  General: Normal range of motion.     Cervical back: Normal range of motion.  Skin:    General: Skin is warm.  Neurological:     General: No focal deficit present.     Mental Status: He is alert.  Psychiatric:        Mood and Affect: Mood normal.       EKG:   Recent Labs: 07/10/2022: Hemoglobin 13.3; Platelets 279.0 11/23/2022: ALT 43; BUN 18; Creatinine, Ser 1.39; Potassium 4.6; Sodium 145    Lipid Panel    Component Value Date/Time   CHOL 170 11/23/2022 0839   TRIG 57 11/23/2022 0839   HDL 70 11/23/2022 0839   CHOLHDL 2.4 11/23/2022 0839   CHOLHDL 3 07/10/2022 1607   VLDL 20.6 07/10/2022 1607   LDLCALC 89 11/23/2022 0839   LDLDIRECT 164.5 06/02/2011 0824      Other studies Reviewed: Additional studies/ records that were reviewed today include:  Review of the above records demonstrates:       No data to display            ASSESSMENT AND PLAN:    ICD-10-CM   1.  Benign essential HTN  I10    stable    2. DOE (dyspnea on exertion)  R06.09     3. Hyperlipidemia, unspecified hyperlipidemia type  E78.5    LDL 70, cta coronaries 2022, ca score 31, normal coronaries, so its ok.    4. Nonrheumatic mitral valve regurgitation  I34.0    mild       Problem List Items Addressed This Visit       Cardiovascular and Mediastinum   Benign essential HTN - Primary     Other   Hyperlipidemia   DOE (dyspnea on exertion)   Other Visit Diagnoses     Nonrheumatic mitral valve regurgitation       mild          Disposition:   Return in about 6 months (around 12/02/2023).    Total time spent: 30 minutes  Signed,  Adrian Blackwater, MD  06/04/2023 10:11 AM    [facility]

## 2023-06-24 IMAGING — DX DG CHEST 2V
2 series · 2 of 2 positions shown · non-contrast
Comparison: Chest x-ray dated October 22, 2019.

CLINICAL DATA: Intermittent chest pain and shortness of breath for
the past 2 months.

EXAM:
CHEST - 2 VIEW

[chest pa]
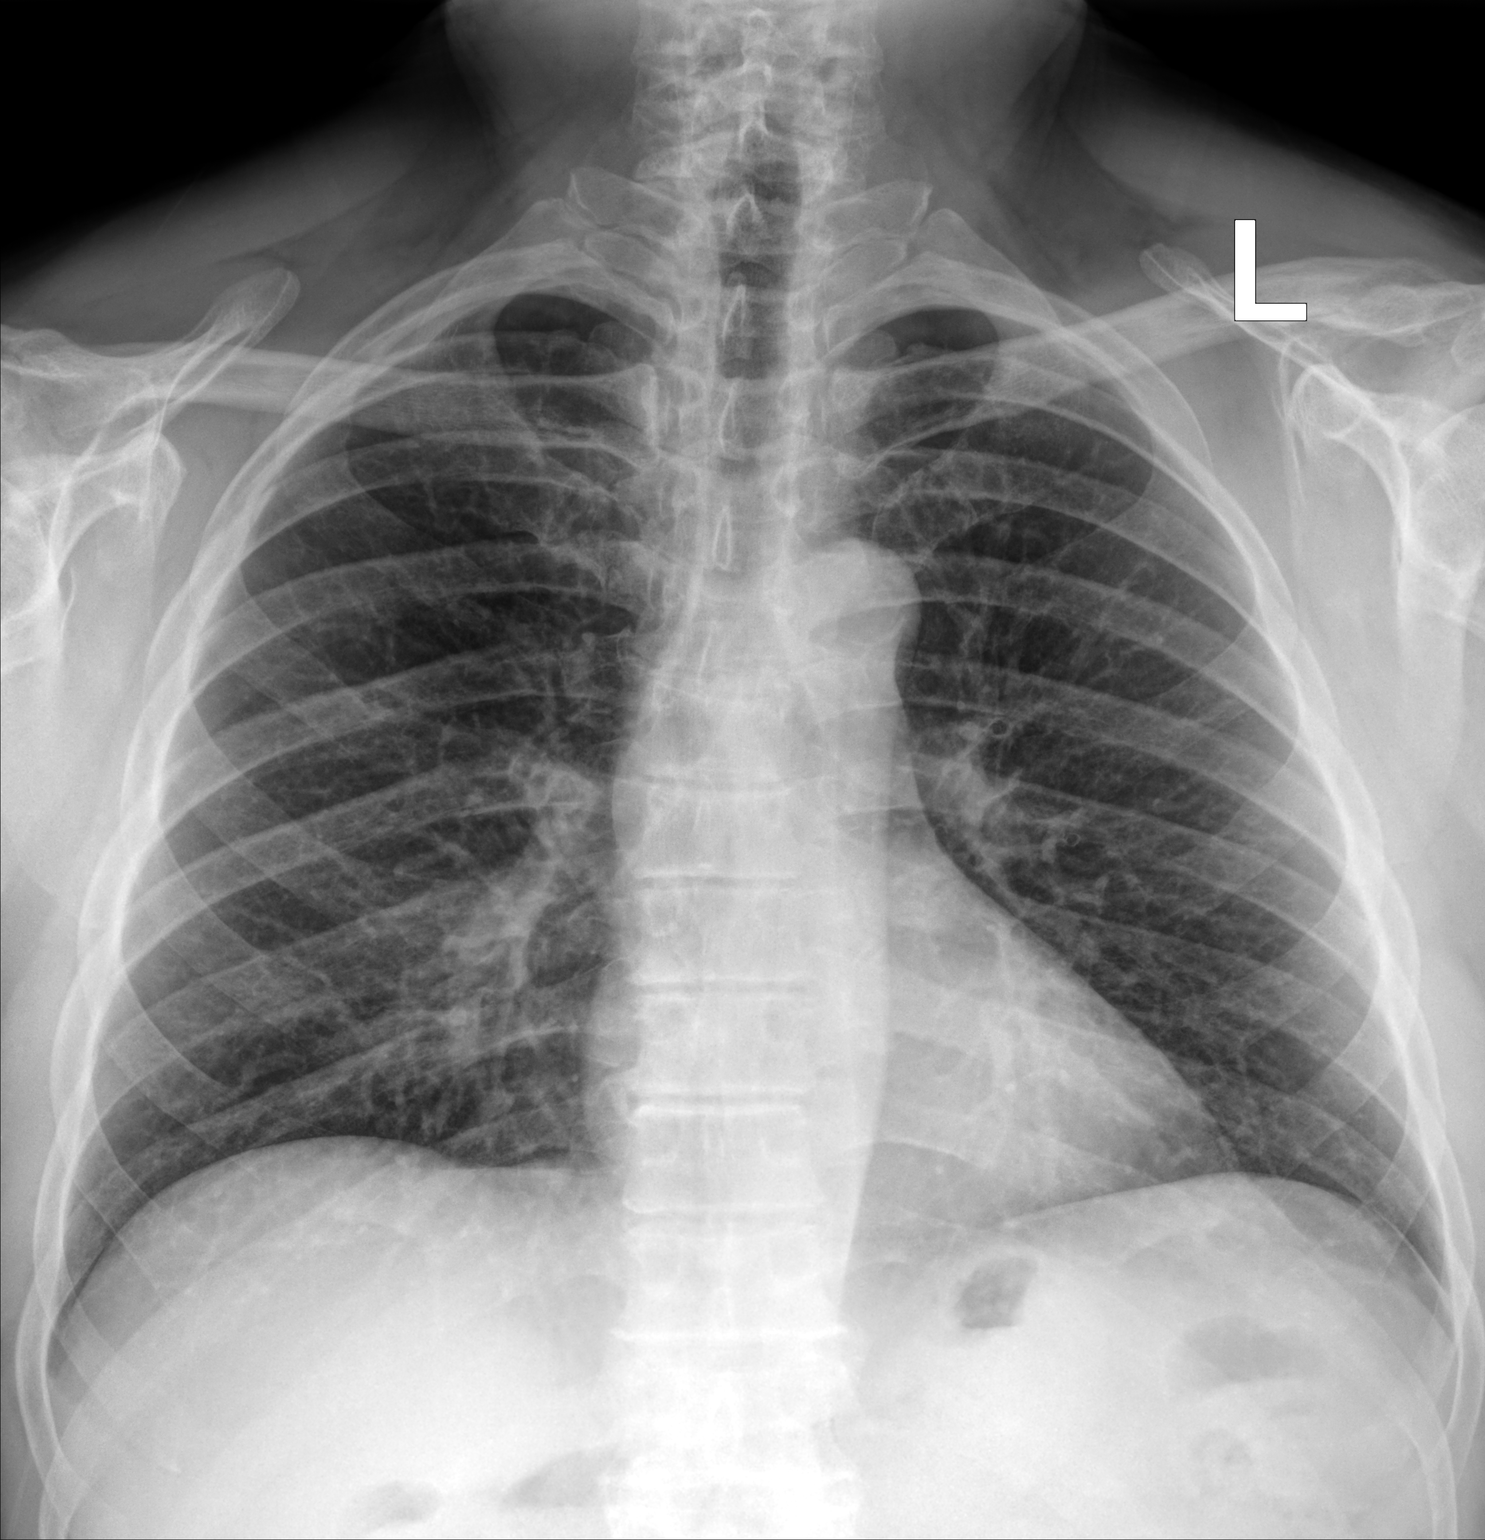

[chest lat]
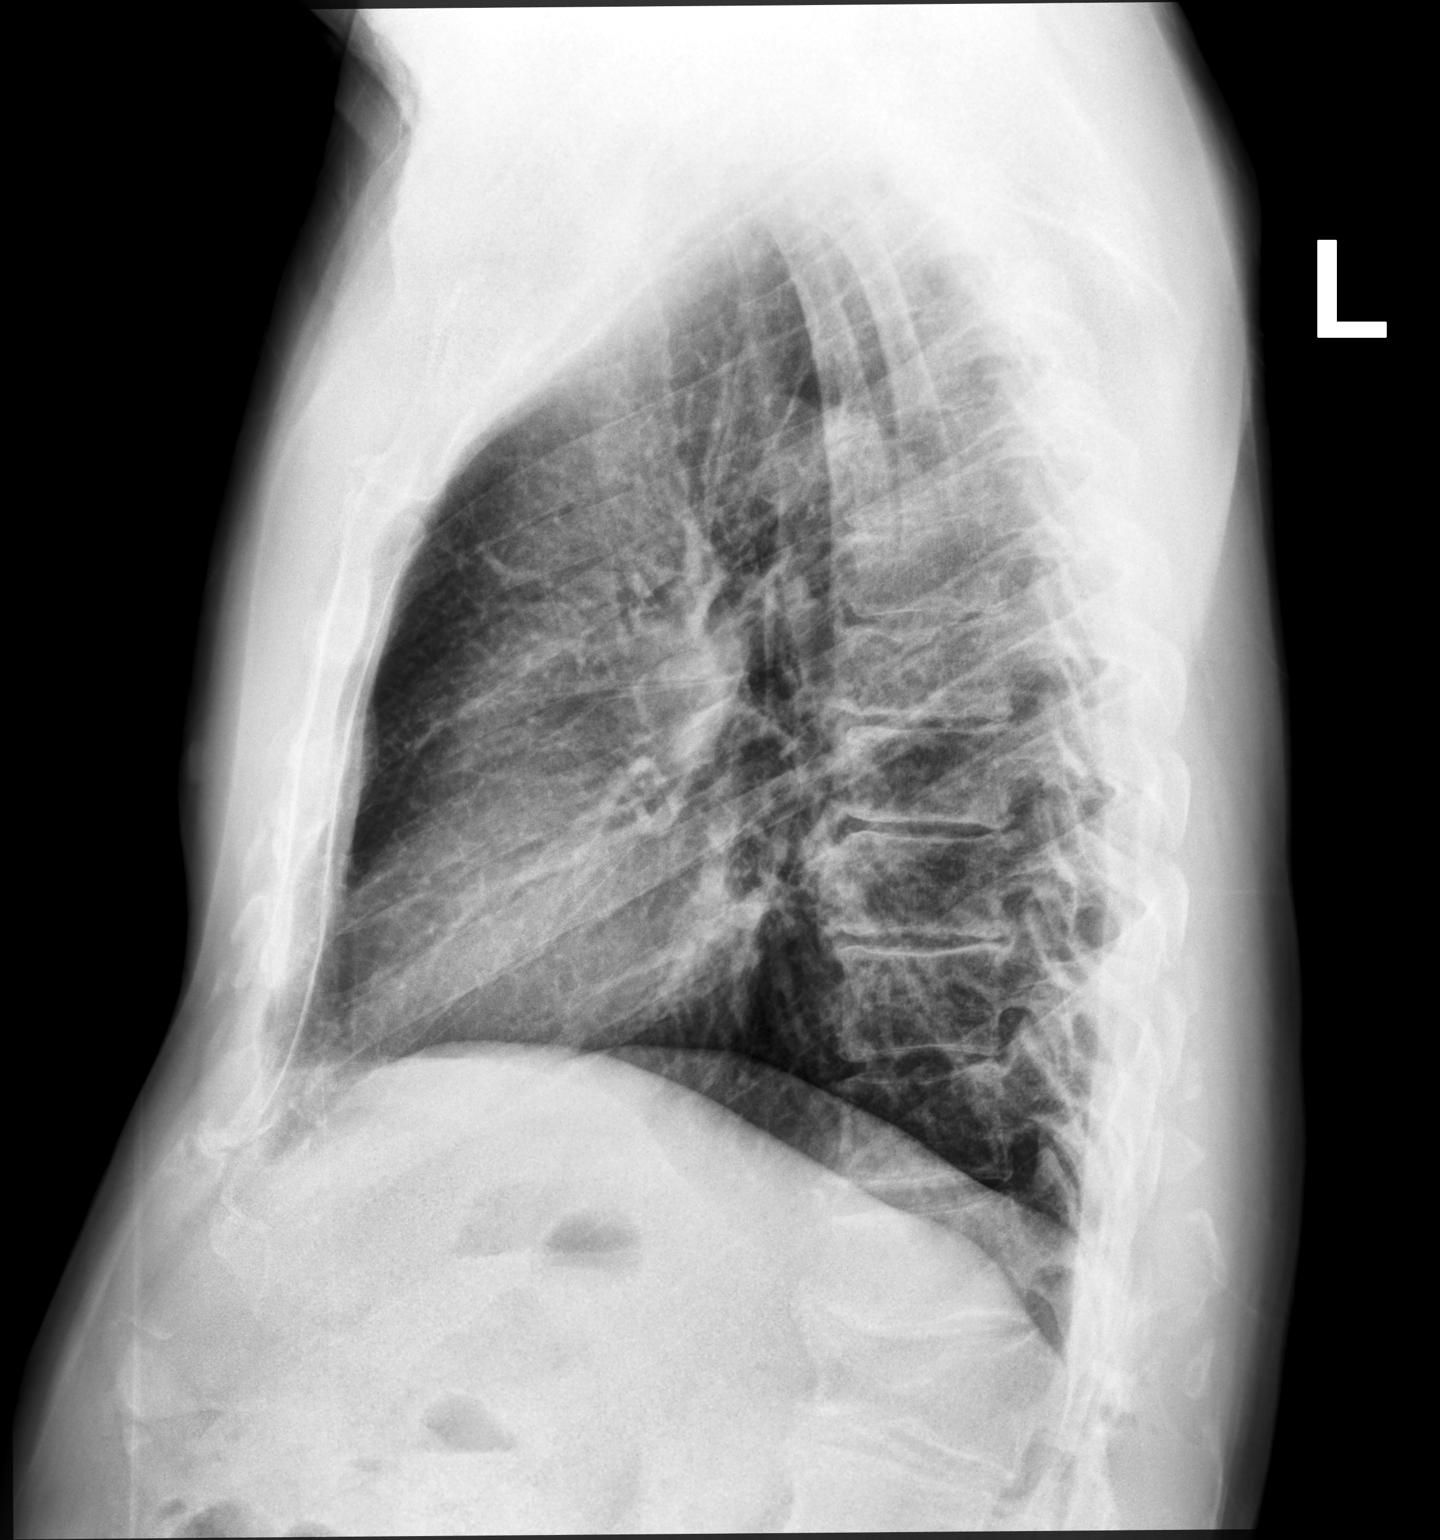

[2 of 2 positions shown; findings below may reference images not displayed]

FINDINGS: The heart size and mediastinal contours are within normal limits.
Normal pulmonary vascularity. No focal consolidation, pleural
effusion, or pneumothorax. No acute osseous abnormality. Chronic L1
compression deformity again noted.
IMPRESSION: No active cardiopulmonary disease.

## 2023-07-10 LAB — LAB REPORT - SCANNED: EGFR: 60

## 2023-07-13 ENCOUNTER — Encounter: Payer: BC Managed Care – PPO | Admitting: Internal Medicine

## 2023-07-16 ENCOUNTER — Encounter: Payer: Self-pay | Admitting: Internal Medicine

## 2023-07-16 ENCOUNTER — Ambulatory Visit (INDEPENDENT_AMBULATORY_CARE_PROVIDER_SITE_OTHER): Payer: Managed Care, Other (non HMO) | Admitting: Internal Medicine

## 2023-07-16 VITALS — BP 102/60 | HR 71 | Temp 98.6°F | Ht 67.5 in | Wt 193.0 lb

## 2023-07-16 DIAGNOSIS — N1831 Chronic kidney disease, stage 3a: Secondary | ICD-10-CM | POA: Diagnosis not present

## 2023-07-16 DIAGNOSIS — I10 Essential (primary) hypertension: Secondary | ICD-10-CM | POA: Diagnosis not present

## 2023-07-16 DIAGNOSIS — K219 Gastro-esophageal reflux disease without esophagitis: Secondary | ICD-10-CM

## 2023-07-16 DIAGNOSIS — Z0000 Encounter for general adult medical examination without abnormal findings: Secondary | ICD-10-CM | POA: Diagnosis not present

## 2023-07-16 DIAGNOSIS — Z23 Encounter for immunization: Secondary | ICD-10-CM | POA: Diagnosis not present

## 2023-07-16 NOTE — Assessment & Plan Note (Signed)
Last GFR slightly better Rarely takes meloxicam

## 2023-07-16 NOTE — Assessment & Plan Note (Signed)
BP Readings from Last 3 Encounters:  07/16/23 102/60  06/04/23 115/70  11/20/22 120/70   Good control on losartan/hydrochlorothiazide 50/12.5

## 2023-07-16 NOTE — Progress Notes (Signed)
Subjective:    Patient ID: Harold Li, male    DOB: [birth year], 63 y.o.   MRN: [id number]  HPI Here for physical  No new concerns Did see nephrologist--going back on Thursday Only rarely takes the meloxicam Did have blood work done last week---creatinine only borderline high at 1.32 (GFR 60) CBC was normal  Still sees cardiologist--Dr Welton Flakes Was referred there after ER visit Did have stress test that was okay  Current Outpatient Medications on File Prior to Visit  Medication Sig Dispense Refill   acyclovir (ZOVIRAX) 200 MG capsule Take by mouth.     losartan-hydrochlorothiazide (HYZAAR) 50-12.5 MG tablet TAKE 1 TABLET BY MOUTH EVERY DAY 90 tablet 3   meloxicam (MOBIC) 15 MG tablet TAKE 1 TABLET (15 MG TOTAL) BY MOUTH DAILY AS NEEDED FOR PAIN. 90 tablet 1   Multiple Vitamin (MULTI-VITAMIN PO) Take by mouth.     rosuvastatin (CRESTOR) 10 MG tablet TAKE 1 TABLET BY MOUTH EVERY DAY 90 tablet 0   No current facility-administered medications on file prior to visit.    Allergies  Allergen Reactions   Citalopram Hydrobromide     REACTION: no energy, weak    Past Medical History:  Diagnosis Date   Depression    History of nephrolithiasis    Dr. Earlene Plater   Hyperlipidemia    Hypertension     Past Surgical History:  Procedure Laterality Date   COLONOSCOPY     PARTIAL NEPHRECTOMY  09/11/2009   for oncocytoma    Family History  Problem Relation Age of Onset   Diabetes Mother    Arthritis Mother    Stroke Mother    Coronary artery disease Father    Arthritis Father    Stroke Father        in eye   Blindness Father    Colon cancer Neg Hx    Pancreatic cancer Neg Hx    Rectal cancer Neg Hx    Stomach cancer Neg Hx    Colon polyps Neg Hx    Esophageal cancer Neg Hx     Social History   Socioeconomic History   Marital status: Married    Spouse name: Not on file   Number of children: Not on file   Years of education: Not on file   Highest education level: Not on  file  Occupational History   Occupation:  [employer]    Comment:  Control and instrumentation engineer supply  Tobacco Use   Smoking status: Former    Current packs/day: 0.00    Types: Cigarettes    Quit date: 08/11/2009    Years since quitting: 13.9   Smokeless tobacco: Never  Vaping Use   Vaping status: Never Used  Substance and Sexual Activity   Alcohol use: Yes    Comment: extrememly rare about twice yearly   Drug use: No   Sexual activity: Not on file  Other Topics Concern   Not on file  Social History Narrative   Married 1/15   Social Determinants of Health   Financial Resource Strain: Not on file  Food Insecurity: Not on file  Transportation Needs: Not on file  Physical Activity: Not on file  Stress: Not on file  Social Connections: Not on file  Intimate Partner Violence: Not on file   Review of Systems  Constitutional:  Negative for fatigue and unexpected weight change.       Now doing home project---remodeling bathroom (so no exercise while doing this) Wears seat belt  HENT:  Negative for dental problem, hearing loss and tinnitus.        Keeps up with dentist  Eyes:        No diplopia or unilateral vision loss---but vision is fuzzy (needs exam)  Respiratory:  Negative for cough, chest tightness and shortness of breath.   Cardiovascular:  Positive for palpitations. Negative for chest pain and leg swelling.  Gastrointestinal:  Negative for constipation.       Rare blood on paper/stool Some heartburn--uses OTC meds prn (rare dysphagia--discussed)  Endocrine: Negative for polydipsia and polyuria.  Genitourinary:  Negative for difficulty urinating and frequency.       No sexual problems  Musculoskeletal:  Positive for back pain. Negative for arthralgias and joint swelling.       Monthly chiropractor visits and prn  Skin:  Negative for rash.       Just went to derm--cryotherapy all over  Allergic/Immunologic: Positive for environmental allergies. Negative for  immunocompromised state.       Seasonal--no meds  Neurological:  Negative for dizziness, syncope, light-headedness and headaches.  Hematological:  Negative for adenopathy. Bruises/bleeds easily.  Psychiatric/Behavioral:  Negative for dysphoric mood and sleep disturbance.        Objective:   Physical Exam Constitutional:      Appearance: Normal appearance.  HENT:     Mouth/Throat:     Pharynx: No oropharyngeal exudate or posterior oropharyngeal erythema.  Eyes:     Conjunctiva/sclera: Conjunctivae normal.     Pupils: Pupils are equal, round, and reactive to light.  Cardiovascular:     Rate and Rhythm: Normal rate and regular rhythm.     Pulses: Normal pulses.     Heart sounds: No murmur heard.    No gallop.  Pulmonary:     Effort: Pulmonary effort is normal.     Breath sounds: Normal breath sounds. No wheezing or rales.  Abdominal:     Palpations: Abdomen is soft.     Tenderness: There is no abdominal tenderness.  Musculoskeletal:     Cervical back: Neck supple.     Right lower leg: No edema.     Left lower leg: No edema.  Lymphadenopathy:     Cervical: No cervical adenopathy.  Skin:    Findings: No lesion or rash.  Neurological:     General: No focal deficit present.     Mental Status: He is alert and oriented to person, place, and time.  Psychiatric:        Mood and Affect: Mood normal.        Behavior: Behavior normal.            Assessment & Plan:

## 2023-07-16 NOTE — Assessment & Plan Note (Signed)
Discussed taking 1-2 weeks of omeprazole if his acid symptoms act up (or any swallowing problems)

## 2023-07-16 NOTE — Assessment & Plan Note (Signed)
Stays active Colon due 2032---would push up if he sees blood regularly Defer PSA--doesn't need labs today Flu vaccine today Still prefers no COVID vaccine

## 2023-08-02 ENCOUNTER — Other Ambulatory Visit: Payer: Self-pay | Admitting: Cardiovascular Disease

## 2023-08-02 DIAGNOSIS — E782 Mixed hyperlipidemia: Secondary | ICD-10-CM

## 2023-09-20 ENCOUNTER — Other Ambulatory Visit: Payer: Self-pay | Admitting: Cardiovascular Disease

## 2023-09-20 DIAGNOSIS — E782 Mixed hyperlipidemia: Secondary | ICD-10-CM

## 2023-12-03 ENCOUNTER — Ambulatory Visit: Payer: Managed Care, Other (non HMO) | Admitting: Cardiovascular Disease

## 2023-12-03 ENCOUNTER — Encounter: Payer: Self-pay | Admitting: Cardiovascular Disease

## 2023-12-03 VITALS — BP 122/76 | HR 70 | Ht 67.0 in | Wt 199.0 lb

## 2023-12-03 DIAGNOSIS — I10 Essential (primary) hypertension: Secondary | ICD-10-CM | POA: Diagnosis not present

## 2023-12-03 DIAGNOSIS — R002 Palpitations: Secondary | ICD-10-CM

## 2023-12-03 DIAGNOSIS — E782 Mixed hyperlipidemia: Secondary | ICD-10-CM | POA: Diagnosis not present

## 2023-12-03 DIAGNOSIS — I340 Nonrheumatic mitral (valve) insufficiency: Secondary | ICD-10-CM

## 2023-12-03 DIAGNOSIS — E785 Hyperlipidemia, unspecified: Secondary | ICD-10-CM

## 2023-12-03 NOTE — Progress Notes (Signed)
 Cardiology Office Note   Date:  12/03/2023   ID:  Keyen, Marban 20-Feb-1961, MRN [id number]  PCP:  Karie Schwalbe, MD  Cardiologist:  Adrian Blackwater, MD      History of Present Illness: Harold Li is a 63 y.o. male who presents for  Chief Complaint  Patient presents with   Follow-up    6 month follow     Occasional palpitations,lasting few seconds      Past Medical History:  Diagnosis Date   Depression    History of nephrolithiasis    Dr. Earlene Plater   Hyperlipidemia    Hypertension      Past Surgical History:  Procedure Laterality Date   COLONOSCOPY     PARTIAL NEPHRECTOMY  09/11/2009   for oncocytoma     Current Outpatient Medications  Medication Sig Dispense Refill   TOLAK 4 % CREA Apply 4 % topically as needed.     acyclovir (ZOVIRAX) 200 MG capsule Take by mouth.     losartan-hydrochlorothiazide (HYZAAR) 50-12.5 MG tablet TAKE 1 TABLET BY MOUTH EVERY DAY 90 tablet 3   meloxicam (MOBIC) 15 MG tablet TAKE 1 TABLET (15 MG TOTAL) BY MOUTH DAILY AS NEEDED FOR PAIN. 90 tablet 1   Multiple Vitamin (MULTI-VITAMIN PO) Take by mouth.     rosuvastatin (CRESTOR) 10 MG tablet TAKE 1 TABLET BY MOUTH EVERY DAY 90 tablet 0   No current facility-administered medications for this visit.    Allergies:   Citalopram hydrobromide    Social History:   reports that he quit smoking about 14 years ago. His smoking use included cigarettes. He has never used smokeless tobacco. He reports current alcohol use. He reports that he does not use drugs.   Family History:  family history includes Arthritis in his father and mother; Blindness in his father; Coronary artery disease in his father; Diabetes in his mother; Stroke in his father and mother.    ROS:     Review of Systems  Constitutional: Negative.   HENT: Negative.    Eyes: Negative.   Respiratory: Negative.    Gastrointestinal: Negative.   Genitourinary: Negative.   Musculoskeletal: Negative.   Skin: Negative.    Neurological: Negative.   Endo/Heme/Allergies: Negative.   Psychiatric/Behavioral: Negative.    All other systems reviewed and are negative.     All other systems are reviewed and negative.    PHYSICAL EXAM: VS:  BP 122/76   Pulse 70   Ht 5\' 7"  (1.702 m)   Wt 199 lb (90.3 kg)   SpO2 98%   BMI 31.17 kg/m  , BMI Body mass index is 31.17 kg/m. Last weight:  Wt Readings from Last 3 Encounters:  12/03/23 199 lb (90.3 kg)  07/16/23 193 lb (87.5 kg)  06/04/23 190 lb 9.6 oz (86.5 kg)     Physical Exam Vitals reviewed.  Constitutional:      Appearance: Normal appearance. He is normal weight.  HENT:     Head: Normocephalic.     Nose: Nose normal.     Mouth/Throat:     Mouth: Mucous membranes are moist.  Eyes:     Pupils: Pupils are equal, round, and reactive to light.  Cardiovascular:     Rate and Rhythm: Normal rate and regular rhythm.     Pulses: Normal pulses.     Heart sounds: Normal heart sounds.  Pulmonary:     Effort: Pulmonary effort is normal.  Abdominal:     General:  Abdomen is flat. Bowel sounds are normal.  Musculoskeletal:        General: Normal range of motion.     Cervical back: Normal range of motion.  Skin:    General: Skin is warm.  Neurological:     General: No focal deficit present.     Mental Status: He is alert.  Psychiatric:        Mood and Affect: Mood normal.       EKG:   Recent Labs: No results found for requested labs within last 365 days.    Lipid Panel    Component Value Date/Time   CHOL 170 11/23/2022 0839   TRIG 57 11/23/2022 0839   HDL 70 11/23/2022 0839   CHOLHDL 2.4 11/23/2022 0839   CHOLHDL 3 07/10/2022 1607   VLDL 20.6 07/10/2022 1607   LDLCALC 89 11/23/2022 0839   LDLDIRECT 164.5 06/02/2011 0824      Other studies Reviewed: Additional studies/ records that were reviewed today include:  Review of the above records demonstrates:       No data to display            ASSESSMENT AND PLAN:     ICD-10-CM   1. Benign essential HTN  I10     2. Hyperlipidemia, unspecified hyperlipidemia type  E78.5     3. Nonrheumatic mitral valve regurgitation  I34.0     4. Mixed hyperlipidemia  E78.2    LDL 70, ca score 31, normal coronaries.    5. Palpitation  R00.2    infrequent lasting few seconds.       Problem List Items Addressed This Visit       Cardiovascular and Mediastinum   Benign essential HTN - Primary     Other   Hyperlipidemia   Other Visit Diagnoses       Nonrheumatic mitral valve regurgitation         Palpitation       infrequent lasting few seconds.          Disposition:   Return in about 3 months (around 03/04/2024).    Total time spent: 30 minutes  Signed,  Adrian Blackwater, MD  12/03/2023 9:30 AM    [facility]

## 2024-01-25 ENCOUNTER — Other Ambulatory Visit: Payer: Self-pay | Admitting: Cardiovascular Disease

## 2024-01-25 DIAGNOSIS — E782 Mixed hyperlipidemia: Secondary | ICD-10-CM

## 2024-02-09 ENCOUNTER — Ambulatory Visit: Admission: EM | Admit: 2024-02-09 | Discharge: 2024-02-09 | Disposition: A | Discharge status: Home / Self Care

## 2024-02-09 DIAGNOSIS — H6121 Impacted cerumen, right ear: Secondary | ICD-10-CM

## 2024-02-09 NOTE — ED Provider Notes (Signed)
 Harold Li    CSN: [id number] Arrival date & time: 02/09/24  1111      History   Chief Complaint Chief Complaint  Patient presents with   Ear Fullness    HPI Harold Li is a 63 y.o. male.  Patient presents with 2-week history of right ear fullness and muffled hearing.  He feels like something is in his ear.  No ear pain, drainage, fever, sore throat, cough.  Treatment attempted with OTC eardrops without relief.  The history is provided by the patient and medical records.    Past Medical History:  Diagnosis Date   Depression    History of nephrolithiasis    Dr. Nolon Baxter   Hyperlipidemia    Hypertension     Patient Active Problem List   Diagnosis Date Noted   Stage 3a chronic kidney disease (HCC) 07/16/2023   Benign essential HTN 03/01/2021   DOE (dyspnea on exertion) 03/01/2021   GERD (gastroesophageal reflux disease) 03/01/2021   Hyperlipidemia    Routine general medical examination at a health care facility 06/01/2011   History of nephrolithiasis    Generalized osteoarthritis 09/02/2010   ERECTILE DYSFUNCTION, ORGANIC 03/11/2010    Past Surgical History:  Procedure Laterality Date   COLONOSCOPY     PARTIAL NEPHRECTOMY  09/11/2009   for oncocytoma       Home Medications    Prior to Admission medications   Medication Sig Start Date End Date Taking? Authorizing Provider  acyclovir (ZOVIRAX) 200 MG capsule Take by mouth. 06/24/21   [provider]  losartan -hydrochlorothiazide (HYZAAR) 50-12.5 MG tablet TAKE 1 TABLET BY MOUTH EVERY DAY 02/08/23   Letvak, Richard I, MD  meloxicam  (MOBIC ) 15 MG tablet TAKE 1 TABLET (15 MG TOTAL) BY MOUTH DAILY AS NEEDED FOR PAIN. 10/15/20   Helaine Llanos, MD  Multiple Vitamin (MULTI-VITAMIN PO) Take by mouth.    [provider]  rosuvastatin (CRESTOR) 10 MG tablet TAKE 1 TABLET BY MOUTH EVERY DAY 01/25/24   Debborah Fairly A, MD  TOLAK 4 % CREA Apply 4 % topically as needed. 10/09/23   [provider]    Family History Family History  Problem Relation Age of Onset   Diabetes Mother    Arthritis Mother    Stroke Mother    Coronary artery disease Father    Arthritis Father    Stroke Father        in eye   Blindness Father    Colon cancer Neg Hx    Pancreatic cancer Neg Hx    Rectal cancer Neg Hx    Stomach cancer Neg Hx    Colon polyps Neg Hx    Esophageal cancer Neg Hx     Social History Social History   Tobacco Use   Smoking status: Former    Current packs/day: 0.00    Types: Cigarettes    Quit date: 08/11/2009    Years since quitting: 14.5   Smokeless tobacco: Never  Vaping Use   Vaping status: Never Used  Substance Use Topics   Alcohol use: Yes    Comment: extrememly rare about twice yearly   Drug use: No     Allergies   Citalopram hydrobromide   Review of Systems Review of Systems  Constitutional:  Negative for chills and fever.  HENT:  Positive for hearing loss. Negative for ear discharge, ear pain and sore throat.   Respiratory:  Negative for cough and shortness of breath.  Physical Exam Triage Vital Signs ED Triage Vitals [02/09/24 1120]  Encounter Vitals Group     BP 132/72     Systolic BP Percentile      Diastolic BP Percentile      Pulse Rate 65     Resp 18     Temp 97.7 F (36.5 C)     Temp src      SpO2 98 %     Weight      Height      Head Circumference      Peak Flow      Pain Score 0     Pain Loc      Pain Education      Exclude from Growth Chart    No data found.  Updated Vital Signs BP 132/72   Pulse 65   Temp 97.7 F (36.5 C)   Resp 18   SpO2 98%   Visual Acuity Right Eye Distance:   Left Eye Distance:   Bilateral Distance:    Right Eye Near:   Left Eye Near:    Bilateral Near:     Physical Exam Constitutional:      General: He is not in acute distress. HENT:     Right Ear: There is impacted cerumen.     Left Ear: Tympanic membrane and ear canal normal.     Nose: Nose normal.      Mouth/Throat:     Mouth: Mucous membranes are moist.     Pharynx: Oropharynx is clear.  Cardiovascular:     Rate and Rhythm: Normal rate and regular rhythm.     Heart sounds: Normal heart sounds.  Pulmonary:     Effort: Pulmonary effort is normal. No respiratory distress.     Breath sounds: Normal breath sounds.  Neurological:     Mental Status: He is alert.      UC Treatments / Results  Labs (all labs ordered are listed, but only abnormal results are displayed) Labs Reviewed - No data to display  EKG   Radiology No results found.  Procedures Procedures (including critical care time)  Medications Ordered in UC Medications - No data to display  Initial Impression / Assessment and Plan / UC Course  I have reviewed the triage vital signs and the nursing notes.  Pertinent labs & imaging results that were available during my care of the patient were reviewed by me and considered in my medical decision making (see chart for details).   Right cerumen impaction.  Afebrile and vital signs are stable.  Cerumen removed via irrigation by RN.  Patient reports relief of symptoms.  Education provided on earwax buildup.  Instructed him to follow-up with his PCP as needed.  He agrees to plan of care. Final Clinical Impressions(s) / UC Diagnoses   Final diagnoses:  Impacted cerumen of right ear     Discharge Instructions      Follow-up with your primary care provider if your symptoms are not improving.    ED Prescriptions   None    PDMP not reviewed this encounter.   Wellington Half, NP 02/09/24 [phone]

## 2024-02-09 NOTE — ED Triage Notes (Signed)
 Patient to Urgent Care with complaints of right sided ear fullness. Feels like something is stuck in his ear. Wears ear plugs at work.   Ear started to feel clogged this morning. Using otc ear drops.

## 2024-02-09 NOTE — Discharge Instructions (Addendum)
 Follow up with your primary care provider if your symptoms are not improving.

## 2024-02-23 ENCOUNTER — Other Ambulatory Visit: Payer: Self-pay | Admitting: Internal Medicine

## 2024-03-04 ENCOUNTER — Encounter: Payer: Self-pay | Admitting: Cardiovascular Disease

## 2024-03-04 ENCOUNTER — Ambulatory Visit (INDEPENDENT_AMBULATORY_CARE_PROVIDER_SITE_OTHER): Admitting: Cardiovascular Disease

## 2024-03-04 VITALS — BP 102/60 | HR 74 | Ht 67.0 in | Wt 194.6 lb

## 2024-03-04 DIAGNOSIS — R002 Palpitations: Secondary | ICD-10-CM | POA: Diagnosis not present

## 2024-03-04 DIAGNOSIS — I340 Nonrheumatic mitral (valve) insufficiency: Secondary | ICD-10-CM

## 2024-03-04 DIAGNOSIS — I10 Essential (primary) hypertension: Secondary | ICD-10-CM | POA: Diagnosis not present

## 2024-03-04 DIAGNOSIS — E782 Mixed hyperlipidemia: Secondary | ICD-10-CM | POA: Diagnosis not present

## 2024-03-04 DIAGNOSIS — R0609 Other forms of dyspnea: Secondary | ICD-10-CM

## 2024-03-04 DIAGNOSIS — E785 Hyperlipidemia, unspecified: Secondary | ICD-10-CM

## 2024-03-04 MED ORDER — METOPROLOL SUCCINATE ER 25 MG PO TB24: 25.0000 mg | ORAL_TABLET | Freq: Every day | ORAL | 11 refills | Status: DC

## 2024-03-04 NOTE — Progress Notes (Signed)
 Cardiology Office Note   Date:  03/04/2024   ID:  Jabaree, Mercado [birth year], MRN [id number]  PCP:  Jimmy Charlie FERNS, MD  Cardiologist:  Denyse Bathe, MD      History of Present Illness: Harold Li is a 63 y.o. male who presents for  Chief Complaint  Patient presents with   Follow-up    3 month follow up    Has palpitations, at evening time      Past Medical History:  Diagnosis Date   Depression    History of nephrolithiasis    Dr. Nicholaus   Hyperlipidemia    Hypertension      Past Surgical History:  Procedure Laterality Date   COLONOSCOPY     PARTIAL NEPHRECTOMY  09/11/2009   for oncocytoma     Current Outpatient Medications  Medication Sig Dispense Refill   acyclovir (ZOVIRAX) 200 MG capsule Take by mouth.     losartan -hydrochlorothiazide (HYZAAR) 50-12.5 MG tablet TAKE 1 TABLET BY MOUTH EVERY DAY 90 tablet 0   meloxicam  (MOBIC ) 15 MG tablet TAKE 1 TABLET (15 MG TOTAL) BY MOUTH DAILY AS NEEDED FOR PAIN. 90 tablet 1   metoprolol succinate (TOPROL XL) 25 MG 24 hr tablet Take 1 tablet (25 mg total) by mouth daily. 30 tablet 11   Multiple Vitamin (MULTI-VITAMIN PO) Take by mouth.     rosuvastatin (CRESTOR) 10 MG tablet TAKE 1 TABLET BY MOUTH EVERY DAY 90 tablet 0   TOLAK 4 % CREA Apply 4 % topically as needed.     No current facility-administered medications for this visit.    Allergies:   Citalopram hydrobromide    Social History:   reports that he quit smoking about 14 years ago. His smoking use included cigarettes. He has never used smokeless tobacco. He reports current alcohol use. He reports that he does not use drugs.   Family History:  family history includes Arthritis in his father and mother; Blindness in his father; Coronary artery disease in his father; Diabetes in his mother; Stroke in his father and mother.    ROS:     Review of Systems  Constitutional: Negative.   HENT: Negative.    Eyes: Negative.   Respiratory: Negative.     Gastrointestinal: Negative.   Genitourinary: Negative.   Musculoskeletal: Negative.   Skin: Negative.   Neurological: Negative.   Endo/Heme/Allergies: Negative.   Psychiatric/Behavioral: Negative.    All other systems reviewed and are negative.     All other systems are reviewed and negative.    PHYSICAL EXAM: VS:  BP 102/60   Pulse 74   Ht 5' 7 (1.702 m)   Wt 194 lb 9.6 oz (88.3 kg)   SpO2 97%   BMI 30.48 kg/m  , BMI Body mass index is 30.48 kg/m. Last weight:  Wt Readings from Last 3 Encounters:  03/04/24 194 lb 9.6 oz (88.3 kg)  12/03/23 199 lb (90.3 kg)  07/16/23 193 lb (87.5 kg)     Physical Exam Vitals reviewed.  Constitutional:      Appearance: Normal appearance. He is normal weight.  HENT:     Head: Normocephalic.     Nose: Nose normal.     Mouth/Throat:     Mouth: Mucous membranes are moist.   Eyes:     Pupils: Pupils are equal, round, and reactive to light.    Cardiovascular:     Rate and Rhythm: Normal rate and regular rhythm.     Pulses:  Normal pulses.     Heart sounds: Normal heart sounds.  Pulmonary:     Effort: Pulmonary effort is normal.  Abdominal:     General: Abdomen is flat. Bowel sounds are normal.   Musculoskeletal:        General: Normal range of motion.     Cervical back: Normal range of motion.   Skin:    General: Skin is warm.   Neurological:     General: No focal deficit present.     Mental Status: He is alert.   Psychiatric:        Mood and Affect: Mood normal.       EKG:   Recent Labs: No results found for requested labs within last 365 days.    Lipid Panel    Component Value Date/Time   CHOL 170 11/23/2022 0839   TRIG 57 11/23/2022 0839   HDL 70 11/23/2022 0839   CHOLHDL 2.4 11/23/2022 0839   CHOLHDL 3 07/10/2022 1607   VLDL 20.6 07/10/2022 1607   LDLCALC 89 11/23/2022 0839   LDLDIRECT 164.5 06/02/2011 0824      Other studies Reviewed: Additional studies/ records that were reviewed today  include:  Review of the above records demonstrates:       No data to display            ASSESSMENT AND PLAN:    ICD-10-CM   1. Benign essential HTN  I10 Comprehensive metabolic panel    CBC with Differential/Platelet    Lipid Profile    PSA    PCV ECHOCARDIOGRAM COMPLETE    metoprolol succinate (TOPROL XL) 25 MG 24 hr tablet    2. Hyperlipidemia, unspecified hyperlipidemia type  E78.5 Comprehensive metabolic panel    CBC with Differential/Platelet    Lipid Profile    PSA    PCV ECHOCARDIOGRAM COMPLETE    metoprolol succinate (TOPROL XL) 25 MG 24 hr tablet    3. Nonrheumatic mitral valve regurgitation  I34.0 Comprehensive metabolic panel    CBC with Differential/Platelet    Lipid Profile    PSA    PCV ECHOCARDIOGRAM COMPLETE    metoprolol succinate (TOPROL XL) 25 MG 24 hr tablet    4. Mixed hyperlipidemia  E78.2 Comprehensive metabolic panel    CBC with Differential/Platelet    Lipid Profile    PSA    PCV ECHOCARDIOGRAM COMPLETE    metoprolol succinate (TOPROL XL) 25 MG 24 hr tablet    5. Palpitation  R00.2 Comprehensive metabolic panel    CBC with Differential/Platelet    Lipid Profile    PSA    PCV ECHOCARDIOGRAM COMPLETE    metoprolol succinate (TOPROL XL) 25 MG 24 hr tablet   Has  repeated palpitations, advise metoprolol 25 mg daily and get echo, check labs    6. DOE (dyspnea on exertion)  R06.09 Comprehensive metabolic panel    CBC with Differential/Platelet    Lipid Profile    PSA    PCV ECHOCARDIOGRAM COMPLETE    metoprolol succinate (TOPROL XL) 25 MG 24 hr tablet       Problem List Items Addressed This Visit       Cardiovascular and Mediastinum   Benign essential HTN - Primary   Relevant Medications   metoprolol succinate (TOPROL XL) 25 MG 24 hr tablet   Other Relevant Orders   Comprehensive metabolic panel   CBC with Differential/Platelet   Lipid Profile   PSA   PCV ECHOCARDIOGRAM COMPLETE     Other  Hyperlipidemia   Relevant  Medications   metoprolol succinate (TOPROL XL) 25 MG 24 hr tablet   Other Relevant Orders   Comprehensive metabolic panel   CBC with Differential/Platelet   Lipid Profile   PSA   PCV ECHOCARDIOGRAM COMPLETE   Comprehensive metabolic panel   CBC with Differential/Platelet   Lipid Profile   PSA   PCV ECHOCARDIOGRAM COMPLETE   DOE (dyspnea on exertion)   Relevant Medications   metoprolol succinate (TOPROL XL) 25 MG 24 hr tablet   Other Relevant Orders   Comprehensive metabolic panel   CBC with Differential/Platelet   Lipid Profile   PSA   PCV ECHOCARDIOGRAM COMPLETE   Other Visit Diagnoses       Nonrheumatic mitral valve regurgitation       Relevant Medications   metoprolol succinate (TOPROL XL) 25 MG 24 hr tablet   Other Relevant Orders   Comprehensive metabolic panel   CBC with Differential/Platelet   Lipid Profile   PSA   PCV ECHOCARDIOGRAM COMPLETE     Palpitation       Has  repeated palpitations, advise metoprolol 25 mg daily and get echo, check labs   Relevant Medications   metoprolol succinate (TOPROL XL) 25 MG 24 hr tablet   Other Relevant Orders   Comprehensive metabolic panel   CBC with Differential/Platelet   Lipid Profile   PSA   PCV ECHOCARDIOGRAM COMPLETE          Disposition:   Return in about 4 weeks (around 04/01/2024) for echo, and f/u 1 month, also set up with Dr Tejansie as primary..    Total time spent: 30 minutes  Signed,  Denyse Bathe, MD  03/04/2024 9:45 AM    [facility]

## 2024-03-12 ENCOUNTER — Other Ambulatory Visit

## 2024-03-13 LAB — CBC WITH DIFFERENTIAL/PLATELET
Basophils Absolute: 0 10*3/uL (ref 0.0–0.2)
Basos: 1 %
EOS (ABSOLUTE): 0.1 10*3/uL (ref 0.0–0.4)
Eos: 2 %
Hematocrit: 41.4 % (ref 37.5–51.0)
Hemoglobin: 13.7 g/dL (ref 13.0–17.7)
Immature Grans (Abs): 0 10*3/uL (ref 0.0–0.1)
Immature Granulocytes: 0 %
Lymphocytes Absolute: 1.7 10*3/uL (ref 0.7–3.1)
Lymphs: 32 %
MCH: 30.9 pg (ref 26.6–33.0)
MCHC: 33.1 g/dL (ref 31.5–35.7)
MCV: 94 fL (ref 79–97)
Monocytes Absolute: 0.5 10*3/uL (ref 0.1–0.9)
Monocytes: 9 %
Neutrophils Absolute: 3 10*3/uL (ref 1.4–7.0)
Neutrophils: 56 %
Platelets: 244 10*3/uL (ref 150–450)
RBC: 4.43 x10E6/uL (ref 4.14–5.80)
RDW: 12 % (ref 11.6–15.4)
WBC: 5.3 10*3/uL (ref 3.4–10.8)

## 2024-03-13 LAB — LIPID PANEL
Chol/HDL Ratio: 2.8 ratio (ref 0.0–5.0)
Cholesterol, Total: 144 mg/dL (ref 100–199)
HDL: 51 mg/dL (ref >39)
LDL Chol Calc (NIH): 78 mg/dL (ref 0–99)
Triglycerides: 80 mg/dL (ref 0–149)
VLDL Cholesterol Cal: 15 mg/dL (ref 5–40)

## 2024-03-13 LAB — COMPREHENSIVE METABOLIC PANEL WITH GFR
ALT: 31 IU/L (ref 0–44)
AST: 31 IU/L (ref 0–40)
Albumin: 4.2 g/dL (ref 3.9–4.9)
Alkaline Phosphatase: 71 IU/L (ref 44–121)
BUN/Creatinine Ratio: 18 (ref 10–24)
BUN: 23 mg/dL (ref 8–27)
Bilirubin Total: 0.4 mg/dL (ref 0.0–1.2)
CO2: 23 mmol/L (ref 20–29)
Calcium: 9.3 mg/dL (ref 8.6–10.2)
Chloride: 104 mmol/L (ref 96–106)
Creatinine, Ser: 1.29 mg/dL — ABNORMAL HIGH (ref 0.76–1.27)
Globulin, Total: 2.1 g/dL (ref 1.5–4.5)
Glucose: 90 mg/dL (ref 70–99)
Potassium: 4.7 mmol/L (ref 3.5–5.2)
Sodium: 141 mmol/L (ref 134–144)
Total Protein: 6.3 g/dL (ref 6.0–8.5)
eGFR: 62 mL/min/{1.73_m2} (ref >59)

## 2024-03-13 LAB — PSA: Prostate Specific Ag, Serum: 0.5 ng/mL (ref 0.0–4.0)

## 2024-03-25 ENCOUNTER — Encounter: Payer: Self-pay | Admitting: Internal Medicine

## 2024-03-25 ENCOUNTER — Ambulatory Visit (INDEPENDENT_AMBULATORY_CARE_PROVIDER_SITE_OTHER): Admitting: Internal Medicine

## 2024-03-25 VITALS — BP 104/72 | HR 63 | Temp 97.5°F | Ht 67.0 in | Wt 194.4 lb

## 2024-03-25 DIAGNOSIS — I10 Essential (primary) hypertension: Secondary | ICD-10-CM

## 2024-03-25 DIAGNOSIS — K219 Gastro-esophageal reflux disease without esophagitis: Secondary | ICD-10-CM

## 2024-03-25 DIAGNOSIS — Z1331 Encounter for screening for depression: Secondary | ICD-10-CM

## 2024-03-25 DIAGNOSIS — E782 Mixed hyperlipidemia: Secondary | ICD-10-CM

## 2024-03-25 DIAGNOSIS — M545 Low back pain, unspecified: Secondary | ICD-10-CM | POA: Diagnosis not present

## 2024-03-25 NOTE — Progress Notes (Signed)
 New Patient Office Visit  Subjective:  Patient ID: Harold Li, male    DOB: July 25, 1961  Age: 63 y.o. MRN: [id number]  No chief complaint on file.   Here to establish IM care, no new complaints except chronic lower back pain with h/o compression fractures. PMH of HTN and GERD. Labs reviewed and notable for normal gfr, well controlled lipids, unremarkable cbc and normal psa.    No other concerns at this time.   Past Medical History:  Diagnosis Date   Depression    GERD (gastroesophageal reflux disease)    History of nephrolithiasis    Dr. Nicholaus   Hyperlipidemia    Hypertension    Lumbar pain     Past Surgical History:  Procedure Laterality Date   COLONOSCOPY     PARTIAL NEPHRECTOMY  09/11/2009   for oncocytoma    Social History   Socioeconomic History   Marital status: Married    Spouse name: Not on file   Number of children: Not on file   Years of education: Not on file   Highest education level: Not on file  Occupational History   Occupation:  [employer]    Comment:  Control and instrumentation engineer supply  Tobacco Use   Smoking status: Former    Current packs/day: 0.00    Types: Cigarettes    Quit date: 08/11/2009    Years since quitting: 14.6   Smokeless tobacco: Never  Vaping Use   Vaping status: Never Used  Substance and Sexual Activity   Alcohol use: Yes    Comment: extrememly rare about twice yearly   Drug use: No   Sexual activity: Not on file  Other Topics Concern   Not on file  Social History Narrative   Married 1/15   Social Drivers of Health   Financial Resource Strain: Not on file  Food Insecurity: Not on file  Transportation Needs: Not on file  Physical Activity: Not on file  Stress: Not on file  Social Connections: Not on file  Intimate Partner Violence: Not on file    Family History  Problem Relation Age of Onset   Diabetes Mother    Arthritis Mother    Stroke Mother    Coronary artery disease Father    Arthritis  Father    Stroke Father        in eye   Blindness Father    Colon cancer Neg Hx    Pancreatic cancer Neg Hx    Rectal cancer Neg Hx    Stomach cancer Neg Hx    Colon polyps Neg Hx    Esophageal cancer Neg Hx     Allergies  Allergen Reactions   Citalopram Hydrobromide     REACTION: no energy, weak    Outpatient Medications Prior to Visit  Medication Sig   losartan -hydrochlorothiazide (HYZAAR) 50-12.5 MG tablet TAKE 1 TABLET BY MOUTH EVERY DAY   metoprolol  succinate (TOPROL  XL) 25 MG 24 hr tablet Take 1 tablet (25 mg total) by mouth daily.   Multiple Vitamin (MULTI-VITAMIN PO) Take by mouth.   rosuvastatin (CRESTOR) 10 MG tablet TAKE 1 TABLET BY MOUTH EVERY DAY   TOLAK 4 % CREA Apply 4 % topically as needed.   acyclovir (ZOVIRAX) 200 MG capsule Take by mouth. (Patient not taking: Reported on 03/25/2024)   meloxicam  (MOBIC ) 15 MG tablet TAKE 1 TABLET (15 MG TOTAL) BY MOUTH DAILY AS NEEDED FOR PAIN. (Patient not taking: Reported on 03/25/2024)   No facility-administered medications prior  to visit.    Review of Systems  Constitutional:  Negative for weight loss.  HENT: Negative.    Eyes: Negative.   Respiratory: Negative.    Cardiovascular:  Positive for palpitations. Negative for chest pain.  Gastrointestinal:  Positive for heartburn.  Genitourinary:  Negative for frequency.  Musculoskeletal:  Positive for back pain.  Neurological:  Negative for tingling.  Endo/Heme/Allergies:  Positive for environmental allergies.  Psychiatric/Behavioral:  Positive for depression. The patient does not have insomnia.        Objective:   BP 104/72   Pulse 63   Temp (!) 97.5 F (36.4 C)   Ht 5' 7 (1.702 m)   Wt 194 lb 6.4 oz (88.2 kg)   SpO2 97%   BMI 30.45 kg/m   Vitals:   03/25/24 0959  BP: 104/72  Pulse: 63  Temp: (!) 97.5 F (36.4 C)  Height: 5' 7 (1.702 m)  Weight: 194 lb 6.4 oz (88.2 kg)  SpO2: 97%  BMI (Calculated): 30.44    Physical Exam Vitals reviewed.   Constitutional:      Appearance: Normal appearance.  HENT:     Head: Normocephalic.     Left Ear: There is no impacted cerumen.     Nose: Nose normal.     Mouth/Throat:     Mouth: Mucous membranes are moist.     Pharynx: No posterior oropharyngeal erythema.  Eyes:     Extraocular Movements: Extraocular movements intact.     Pupils: Pupils are equal, round, and reactive to light.  Cardiovascular:     Rate and Rhythm: Regular rhythm.     Chest Wall: PMI is not displaced.     Pulses: Normal pulses.     Heart sounds: Normal heart sounds. No murmur heard. Pulmonary:     Effort: Pulmonary effort is normal.     Breath sounds: Normal air entry. No rhonchi or rales.  Abdominal:     General: Abdomen is flat. Bowel sounds are normal. There is no distension.     Palpations: Abdomen is soft. There is no hepatomegaly, splenomegaly or mass.     Tenderness: There is no abdominal tenderness.  Musculoskeletal:        General: Normal range of motion.     Cervical back: Normal range of motion and neck supple.     Right lower leg: No edema.     Left lower leg: No edema.  Skin:    General: Skin is warm and dry.  Neurological:     General: No focal deficit present.     Mental Status: He is alert and oriented to person, place, and time.     Cranial Nerves: No cranial nerve deficit.     Motor: No weakness.  Psychiatric:        Mood and Affect: Mood normal.        Behavior: Behavior normal.      No results found for any visits on 03/25/24.  Recent Results (from the past 2160 hours)  Comprehensive metabolic panel     Status: Abnormal   Collection Time: 03/12/24  8:30 AM  Result Value Ref Range   Glucose 90 70 - 99 mg/dL   BUN 23 8 - 27 mg/dL   Creatinine, Ser 8.70 (H) 0.76 - 1.27 mg/dL   eGFR 62 >40 fO/fpw/8.26   BUN/Creatinine Ratio 18 10 - 24   Sodium 141 134 - 144 mmol/L   Potassium 4.7 3.5 - 5.2 mmol/L   Chloride 104 96 -  106 mmol/L   CO2 23 20 - 29 mmol/L   Calcium 9.3 8.6 -  10.2 mg/dL   Total Protein 6.3 6.0 - 8.5 g/dL   Albumin 4.2 3.9 - 4.9 g/dL   Globulin, Total 2.1 1.5 - 4.5 g/dL   Bilirubin Total 0.4 0.0 - 1.2 mg/dL   Alkaline Phosphatase 71 44 - 121 IU/L   AST 31 0 - 40 IU/L   ALT 31 0 - 44 IU/L  CBC with Differential/Platelet     Status: None   Collection Time: 03/12/24  8:30 AM  Result Value Ref Range   WBC 5.3 3.4 - 10.8 x10E3/uL   RBC 4.43 4.14 - 5.80 x10E6/uL   Hemoglobin 13.7 13.0 - 17.7 g/dL   Hematocrit 58.5 62.4 - 51.0 %   MCV 94 79 - 97 fL   MCH 30.9 26.6 - 33.0 pg   MCHC 33.1 31.5 - 35.7 g/dL   RDW 87.9 88.3 - 84.5 %   Platelets 244 150 - 450 x10E3/uL   Neutrophils 56 Not Estab. %   Lymphs 32 Not Estab. %   Monocytes 9 Not Estab. %   Eos 2 Not Estab. %   Basos 1 Not Estab. %   Neutrophils Absolute 3.0 1.4 - 7.0 x10E3/uL   Lymphocytes Absolute 1.7 0.7 - 3.1 x10E3/uL   Monocytes Absolute 0.5 0.1 - 0.9 x10E3/uL   EOS (ABSOLUTE) 0.1 0.0 - 0.4 x10E3/uL   Basophils Absolute 0.0 0.0 - 0.2 x10E3/uL   Immature Granulocytes 0 Not Estab. %   Immature Grans (Abs) 0.0 0.0 - 0.1 x10E3/uL  Lipid Profile     Status: None   Collection Time: 03/12/24  8:30 AM  Result Value Ref Range   Cholesterol, Total 144 100 - 199 mg/dL   Triglycerides 80 0 - 149 mg/dL   HDL 51 >60 mg/dL   VLDL Cholesterol Cal 15 5 - 40 mg/dL   LDL Chol Calc (NIH) 78 0 - 99 mg/dL   Chol/HDL Ratio 2.8 0.0 - 5.0 ratio    Comment:                                   T. Chol/HDL Ratio                                             Men  Women                               1/2 Avg.Risk  3.4    3.3                                   Avg.Risk  5.0    4.4                                2X Avg.Risk  9.6    7.1                                3X Avg.Risk 23.4   11.0   PSA     Status: None  Collection Time: 03/12/24  8:30 AM  Result Value Ref Range   Prostate Specific Ag, Serum 0.5 0.0 - 4.0 ng/mL    Comment: Roche ECLIA methodology. According to the American Urological Association,  Serum PSA should decrease and remain at undetectable levels after radical prostatectomy. The AUA defines biochemical recurrence as an initial PSA value 0.2 ng/mL or greater followed by a subsequent confirmatory PSA value 0.2 ng/mL or greater. Values obtained with different assay methods or kits cannot be used interchangeably. Results cannot be interpreted as absolute evidence of the presence or absence of malignant disease.       Assessment & Plan:  As per problem list. Would like to defer pain management referral for lumbar pain as not symptomatic presently.  Problem List Items Addressed This Visit       Cardiovascular and Mediastinum   Benign essential HTN - Primary   Relevant Orders   Comprehensive metabolic panel with GFR   CBC With Diff/Platelet     Digestive   GERD (gastroesophageal reflux disease)     Other   Hyperlipidemia   Relevant Orders   Lipid panel   CK   Lumbar pain    Return in about 3 months (around 06/25/2024) for cpe with labs prior.   Total time spent: 20 minutes  Sherrill Cinderella Perry, MD  03/25/2024   This document may have been prepared by [facility] Voice Recognition software and as such may include unintentional dictation errors.

## 2024-03-28 ENCOUNTER — Ambulatory Visit

## 2024-03-28 DIAGNOSIS — E782 Mixed hyperlipidemia: Secondary | ICD-10-CM

## 2024-03-28 DIAGNOSIS — I10 Essential (primary) hypertension: Secondary | ICD-10-CM

## 2024-03-28 DIAGNOSIS — I371 Nonrheumatic pulmonary valve insufficiency: Secondary | ICD-10-CM | POA: Diagnosis not present

## 2024-03-28 DIAGNOSIS — R0609 Other forms of dyspnea: Secondary | ICD-10-CM

## 2024-03-28 DIAGNOSIS — I340 Nonrheumatic mitral (valve) insufficiency: Secondary | ICD-10-CM

## 2024-03-28 DIAGNOSIS — E785 Hyperlipidemia, unspecified: Secondary | ICD-10-CM

## 2024-03-28 DIAGNOSIS — I361 Nonrheumatic tricuspid (valve) insufficiency: Secondary | ICD-10-CM | POA: Diagnosis not present

## 2024-03-28 DIAGNOSIS — R002 Palpitations: Secondary | ICD-10-CM

## 2024-04-01 ENCOUNTER — Encounter: Payer: Self-pay | Admitting: Cardiovascular Disease

## 2024-04-01 ENCOUNTER — Ambulatory Visit (INDEPENDENT_AMBULATORY_CARE_PROVIDER_SITE_OTHER): Admitting: Cardiovascular Disease

## 2024-04-01 VITALS — BP 118/74 | HR 67 | Ht 67.0 in | Wt 195.2 lb

## 2024-04-01 DIAGNOSIS — I10 Essential (primary) hypertension: Secondary | ICD-10-CM | POA: Diagnosis not present

## 2024-04-01 DIAGNOSIS — R0789 Other chest pain: Secondary | ICD-10-CM | POA: Diagnosis not present

## 2024-04-01 DIAGNOSIS — I340 Nonrheumatic mitral (valve) insufficiency: Secondary | ICD-10-CM

## 2024-04-01 DIAGNOSIS — E782 Mixed hyperlipidemia: Secondary | ICD-10-CM

## 2024-04-01 DIAGNOSIS — E785 Hyperlipidemia, unspecified: Secondary | ICD-10-CM

## 2024-04-01 DIAGNOSIS — R002 Palpitations: Secondary | ICD-10-CM

## 2024-04-01 DIAGNOSIS — R0609 Other forms of dyspnea: Secondary | ICD-10-CM

## 2024-04-01 NOTE — Progress Notes (Signed)
 Cardiology Office Note   Date:  04/01/2024   ID:  Dallis, Czaja 1960/12/29, MRN [id number]  PCP:  Jimmy Charlie FERNS, MD  Cardiologist:  Denyse Bathe, MD      History of Present Illness: Harold Li is a 63 y.o. male who presents for  Chief Complaint  Patient presents with   Follow-up    Has no difference with treatment of flutter. Has tightness in chest laying down.      Past Medical History:  Diagnosis Date   Depression    GERD (gastroesophageal reflux disease)    History of nephrolithiasis    Dr. Nicholaus   Hyperlipidemia    Hypertension    Lumbar pain      Past Surgical History:  Procedure Laterality Date   COLONOSCOPY     PARTIAL NEPHRECTOMY  09/11/2009   for oncocytoma     Current Outpatient Medications  Medication Sig Dispense Refill   losartan -hydrochlorothiazide (HYZAAR) 50-12.5 MG tablet TAKE 1 TABLET BY MOUTH EVERY DAY 90 tablet 0   metoprolol  succinate (TOPROL  XL) 25 MG 24 hr tablet Take 1 tablet (25 mg total) by mouth daily. 30 tablet 11   Multiple Vitamin (MULTI-VITAMIN PO) Take by mouth.     rosuvastatin (CRESTOR) 10 MG tablet TAKE 1 TABLET BY MOUTH EVERY DAY 90 tablet 0   TOLAK 4 % CREA Apply 4 % topically as needed.     No current facility-administered medications for this visit.    Allergies:   Citalopram hydrobromide    Social History:   reports that he quit smoking about 14 years ago. His smoking use included cigarettes. He has never used smokeless tobacco. He reports current alcohol use. He reports that he does not use drugs.   Family History:  family history includes Arthritis in his father and mother; Blindness in his father; Coronary artery disease in his father; Diabetes in his mother; Stroke in his father and mother.    ROS:     Review of Systems  Constitutional: Negative.   HENT: Negative.    Eyes: Negative.   Respiratory: Negative.    Gastrointestinal: Negative.   Genitourinary: Negative.   Musculoskeletal: Negative.    Skin: Negative.   Neurological: Negative.   Endo/Heme/Allergies: Negative.   Psychiatric/Behavioral: Negative.    All other systems reviewed and are negative.     All other systems are reviewed and negative.    PHYSICAL EXAM: VS:  BP 118/74   Pulse 67   Ht 5' 7 (1.702 m)   Wt 195 lb 3.2 oz (88.5 kg)   SpO2 97%   BMI 30.57 kg/m  , BMI Body mass index is 30.57 kg/m. Last weight:  Wt Readings from Last 3 Encounters:  04/01/24 195 lb 3.2 oz (88.5 kg)  03/25/24 194 lb 6.4 oz (88.2 kg)  03/04/24 194 lb 9.6 oz (88.3 kg)     Physical Exam Vitals reviewed.  Constitutional:      Appearance: Normal appearance. He is normal weight.  HENT:     Head: Normocephalic.     Nose: Nose normal.     Mouth/Throat:     Mouth: Mucous membranes are moist.  Eyes:     Pupils: Pupils are equal, round, and reactive to light.  Cardiovascular:     Rate and Rhythm: Normal rate and regular rhythm.     Pulses: Normal pulses.     Heart sounds: Normal heart sounds.  Pulmonary:     Effort: Pulmonary effort is  normal.  Abdominal:     General: Abdomen is flat. Bowel sounds are normal.  Musculoskeletal:        General: Normal range of motion.     Cervical back: Normal range of motion.  Skin:    General: Skin is warm.  Neurological:     General: No focal deficit present.     Mental Status: He is alert.  Psychiatric:        Mood and Affect: Mood normal.       EKG:   Recent Labs: 03/12/2024: ALT 31; BUN 23; Creatinine, Ser 1.29; Hemoglobin 13.7; Platelets 244; Potassium 4.7; Sodium 141    Lipid Panel    Component Value Date/Time   CHOL 144 03/12/2024 0830   TRIG 80 03/12/2024 0830   HDL 51 03/12/2024 0830   CHOLHDL 2.8 03/12/2024 0830   CHOLHDL 3 07/10/2022 1607   VLDL 20.6 07/10/2022 1607   LDLCALC 78 03/12/2024 0830   LDLDIRECT 164.5 06/02/2011 0824      Other studies Reviewed: Additional studies/ records that were reviewed today include:  Review of the above records  demonstrates:       No data to display            ASSESSMENT AND PLAN:    ICD-10-CM   1. Other chest pain  R07.89 MYOCARDIAL PERFUSION IMAGING    CARDIAC EVENT MONITOR   Advise stress test    2. Benign essential HTN  I10 MYOCARDIAL PERFUSION IMAGING    CARDIAC EVENT MONITOR    3. Hyperlipidemia, unspecified hyperlipidemia type  E78.5 MYOCARDIAL PERFUSION IMAGING    CARDIAC EVENT MONITOR    4. Nonrheumatic mitral valve regurgitation  I34.0 MYOCARDIAL PERFUSION IMAGING    CARDIAC EVENT MONITOR    5. Mixed hyperlipidemia  E78.2 MYOCARDIAL PERFUSION IMAGING    CARDIAC EVENT MONITOR    6. Palpitation  R00.2 MYOCARDIAL PERFUSION IMAGING    CARDIAC EVENT MONITOR   On metoprolol , still has palpitation and tightness in chest , advise holter.    7. DOE (dyspnea on exertion)  R06.09 MYOCARDIAL PERFUSION IMAGING    CARDIAC EVENT MONITOR       Problem List Items Addressed This Visit       Cardiovascular and Mediastinum   Benign essential HTN   Relevant Orders   MYOCARDIAL PERFUSION IMAGING   CARDIAC EVENT MONITOR     Other   Hyperlipidemia   Relevant Orders   MYOCARDIAL PERFUSION IMAGING   CARDIAC EVENT MONITOR   MYOCARDIAL PERFUSION IMAGING   CARDIAC EVENT MONITOR   DOE (dyspnea on exertion)   Relevant Orders   MYOCARDIAL PERFUSION IMAGING   CARDIAC EVENT MONITOR   Other Visit Diagnoses       Other chest pain    -  Primary   Advise stress test   Relevant Orders   MYOCARDIAL PERFUSION IMAGING   CARDIAC EVENT MONITOR     Nonrheumatic mitral valve regurgitation       Relevant Orders   MYOCARDIAL PERFUSION IMAGING   CARDIAC EVENT MONITOR     Palpitation       On metoprolol , still has palpitation and tightness in chest , advise holter.   Relevant Orders   MYOCARDIAL PERFUSION IMAGING   CARDIAC EVENT MONITOR          Disposition:   Return in about 4 weeks (around 04/29/2024) for stress test and holter monitor and f/u.    Total time spent: 40  minutes  Signed,  Denyse Bathe, MD  04/01/2024 9:37 AM    [facility]

## 2024-04-04 ENCOUNTER — Ambulatory Visit

## 2024-04-04 DIAGNOSIS — I340 Nonrheumatic mitral (valve) insufficiency: Secondary | ICD-10-CM

## 2024-04-04 DIAGNOSIS — R0609 Other forms of dyspnea: Secondary | ICD-10-CM

## 2024-04-04 DIAGNOSIS — R002 Palpitations: Secondary | ICD-10-CM

## 2024-04-04 DIAGNOSIS — E782 Mixed hyperlipidemia: Secondary | ICD-10-CM

## 2024-04-04 DIAGNOSIS — R0789 Other chest pain: Secondary | ICD-10-CM

## 2024-04-04 DIAGNOSIS — E785 Hyperlipidemia, unspecified: Secondary | ICD-10-CM

## 2024-04-04 DIAGNOSIS — I10 Essential (primary) hypertension: Secondary | ICD-10-CM

## 2024-04-18 DIAGNOSIS — R079 Chest pain, unspecified: Secondary | ICD-10-CM | POA: Diagnosis not present

## 2024-04-21 ENCOUNTER — Encounter

## 2024-04-22 ENCOUNTER — Other Ambulatory Visit: Payer: Self-pay | Admitting: Cardiovascular Disease

## 2024-04-22 DIAGNOSIS — E782 Mixed hyperlipidemia: Secondary | ICD-10-CM

## 2024-04-28 ENCOUNTER — Ambulatory Visit

## 2024-04-28 DIAGNOSIS — E785 Hyperlipidemia, unspecified: Secondary | ICD-10-CM

## 2024-04-28 DIAGNOSIS — I10 Essential (primary) hypertension: Secondary | ICD-10-CM

## 2024-04-28 DIAGNOSIS — E782 Mixed hyperlipidemia: Secondary | ICD-10-CM

## 2024-04-28 DIAGNOSIS — R0789 Other chest pain: Secondary | ICD-10-CM

## 2024-04-28 DIAGNOSIS — R0609 Other forms of dyspnea: Secondary | ICD-10-CM | POA: Diagnosis not present

## 2024-04-28 DIAGNOSIS — I340 Nonrheumatic mitral (valve) insufficiency: Secondary | ICD-10-CM | POA: Diagnosis not present

## 2024-04-28 DIAGNOSIS — R002 Palpitations: Secondary | ICD-10-CM

## 2024-05-05 ENCOUNTER — Ambulatory Visit: Admitting: Cardiovascular Disease

## 2024-05-05 ENCOUNTER — Encounter: Payer: Self-pay | Admitting: Cardiovascular Disease

## 2024-05-05 VITALS — BP 102/70 | HR 61 | Ht 67.0 in | Wt 197.0 lb

## 2024-05-05 DIAGNOSIS — E782 Mixed hyperlipidemia: Secondary | ICD-10-CM

## 2024-05-05 DIAGNOSIS — R0789 Other chest pain: Secondary | ICD-10-CM

## 2024-05-05 DIAGNOSIS — R0609 Other forms of dyspnea: Secondary | ICD-10-CM

## 2024-05-05 DIAGNOSIS — I471 Supraventricular tachycardia, unspecified: Secondary | ICD-10-CM | POA: Diagnosis not present

## 2024-05-05 DIAGNOSIS — R002 Palpitations: Secondary | ICD-10-CM | POA: Diagnosis not present

## 2024-05-05 DIAGNOSIS — I340 Nonrheumatic mitral (valve) insufficiency: Secondary | ICD-10-CM

## 2024-05-05 DIAGNOSIS — E785 Hyperlipidemia, unspecified: Secondary | ICD-10-CM

## 2024-05-05 DIAGNOSIS — I10 Essential (primary) hypertension: Secondary | ICD-10-CM

## 2024-05-05 MED ORDER — FLECAINIDE ACETATE 50 MG PO TABS: 50.0000 mg | ORAL_TABLET | Freq: Two times a day (BID) | ORAL | 11 refills | Status: DC

## 2024-05-05 MED ORDER — METOPROLOL SUCCINATE ER 50 MG PO TB24: 50.0000 mg | ORAL_TABLET | Freq: Every day | ORAL | 11 refills | Status: AC

## 2024-05-05 NOTE — Progress Notes (Signed)
 Cardiology Office Note   Date:  05/05/2024   ID:  Olney, Monier Feb 28, 1961, MRN [id number]  PCP:  Jimmy Charlie FERNS, MD  Cardiologist:  Denyse Bathe, MD      History of Present Illness: Harold Li is a 63 y.o. male who presents for  Chief Complaint  Patient presents with   Follow-up    4 week nst/holter results    Still has palpitation, at work lasted 2 hours.      Past Medical History:  Diagnosis Date   Depression    GERD (gastroesophageal reflux disease)    History of nephrolithiasis    Dr. Nicholaus   Hyperlipidemia    Hypertension    Lumbar pain      Past Surgical History:  Procedure Laterality Date   COLONOSCOPY     PARTIAL NEPHRECTOMY  09/11/2009   for oncocytoma     Current Outpatient Medications  Medication Sig Dispense Refill   flecainide  (TAMBOCOR ) 50 MG tablet Take 1 tablet (50 mg total) by mouth 2 (two) times daily. 60 tablet 11   metoprolol  succinate (TOPROL  XL) 50 MG 24 hr tablet Take 1 tablet (50 mg total) by mouth daily. Take with or immediately following a meal. 30 tablet 11   losartan -hydrochlorothiazide (HYZAAR) 50-12.5 MG tablet TAKE 1 TABLET BY MOUTH EVERY DAY 90 tablet 0   Multiple Vitamin (MULTI-VITAMIN PO) Take by mouth.     rosuvastatin (CRESTOR) 10 MG tablet TAKE 1 TABLET BY MOUTH EVERY DAY 90 tablet 0   TOLAK 4 % CREA Apply 4 % topically as needed.     No current facility-administered medications for this visit.    Allergies:   Citalopram hydrobromide    Social History:   reports that he quit smoking about 14 years ago. His smoking use included cigarettes. He has never used smokeless tobacco. He reports current alcohol use. He reports that he does not use drugs.   Family History:  family history includes Arthritis in his father and mother; Blindness in his father; Coronary artery disease in his father; Diabetes in his mother; Stroke in his father and mother.    ROS:     Review of Systems  Constitutional: Negative.    HENT: Negative.    Eyes: Negative.   Respiratory: Negative.    Gastrointestinal: Negative.   Genitourinary: Negative.   Musculoskeletal: Negative.   Skin: Negative.   Neurological: Negative.   Endo/Heme/Allergies: Negative.   Psychiatric/Behavioral: Negative.    All other systems reviewed and are negative.     All other systems are reviewed and negative.    PHYSICAL EXAM: VS:  BP 102/70   Pulse 61   Ht 5' 7 (1.702 m)   Wt 197 lb (89.4 kg)   SpO2 98%   BMI 30.85 kg/m  , BMI Body mass index is 30.85 kg/m. Last weight:  Wt Readings from Last 3 Encounters:  05/05/24 197 lb (89.4 kg)  04/01/24 195 lb 3.2 oz (88.5 kg)  03/25/24 194 lb 6.4 oz (88.2 kg)     Physical Exam Vitals reviewed.  Constitutional:      Appearance: Normal appearance. He is normal weight.  HENT:     Head: Normocephalic.     Nose: Nose normal.     Mouth/Throat:     Mouth: Mucous membranes are moist.  Eyes:     Pupils: Pupils are equal, round, and reactive to light.  Cardiovascular:     Rate and Rhythm: Normal rate and regular  rhythm.     Pulses: Normal pulses.     Heart sounds: Normal heart sounds.  Pulmonary:     Effort: Pulmonary effort is normal.  Abdominal:     General: Abdomen is flat. Bowel sounds are normal.  Musculoskeletal:        General: Normal range of motion.     Cervical back: Normal range of motion.  Skin:    General: Skin is warm.  Neurological:     General: No focal deficit present.     Mental Status: He is alert.  Psychiatric:        Mood and Affect: Mood normal.       EKG:   Recent Labs: 03/12/2024: ALT 31; BUN 23; Creatinine, Ser 1.29; Hemoglobin 13.7; Platelets 244; Potassium 4.7; Sodium 141    Lipid Panel    Component Value Date/Time   CHOL 144 03/12/2024 0830   TRIG 80 03/12/2024 0830   HDL 51 03/12/2024 0830   CHOLHDL 2.8 03/12/2024 0830   CHOLHDL 3 07/10/2022 1607   VLDL 20.6 07/10/2022 1607   LDLCALC 78 03/12/2024 0830   LDLDIRECT 164.5  06/02/2011 0824      Other studies Reviewed: Additional studies/ records that were reviewed today include:  Review of the above records demonstrates:       No data to display            ASSESSMENT AND PLAN:    ICD-10-CM   1. SVT (supraventricular tachycardia) (HCC)  I47.10 flecainide  (TAMBOCOR ) 50 MG tablet    metoprolol  succinate (TOPROL  XL) 50 MG 24 hr tablet   LVEF normal, grade 2 diastolic dysfunction, mild MR, stress test normal, no ischaemia, by monitor SVT 150/min, add flecanie 50 bid, increase metoprolol  to 50.    2. Mixed hyperlipidemia  E78.2 flecainide  (TAMBOCOR ) 50 MG tablet    metoprolol  succinate (TOPROL  XL) 50 MG 24 hr tablet    3. Benign essential HTN  I10 flecainide  (TAMBOCOR ) 50 MG tablet    metoprolol  succinate (TOPROL  XL) 50 MG 24 hr tablet    4. Hyperlipidemia, unspecified hyperlipidemia type  E78.5 flecainide  (TAMBOCOR ) 50 MG tablet    metoprolol  succinate (TOPROL  XL) 50 MG 24 hr tablet    5. Nonrheumatic mitral valve regurgitation  I34.0 flecainide  (TAMBOCOR ) 50 MG tablet    metoprolol  succinate (TOPROL  XL) 50 MG 24 hr tablet    6. Palpitation  R00.2 flecainide  (TAMBOCOR ) 50 MG tablet    metoprolol  succinate (TOPROL  XL) 50 MG 24 hr tablet    7. DOE (dyspnea on exertion)  R06.09 flecainide  (TAMBOCOR ) 50 MG tablet    metoprolol  succinate (TOPROL  XL) 50 MG 24 hr tablet    8. Other chest pain  R07.89 flecainide  (TAMBOCOR ) 50 MG tablet    metoprolol  succinate (TOPROL  XL) 50 MG 24 hr tablet       Problem List Items Addressed This Visit       Cardiovascular and Mediastinum   Benign essential HTN   Relevant Medications   flecainide  (TAMBOCOR ) 50 MG tablet   metoprolol  succinate (TOPROL  XL) 50 MG 24 hr tablet     Other   Hyperlipidemia   Relevant Medications   flecainide  (TAMBOCOR ) 50 MG tablet   metoprolol  succinate (TOPROL  XL) 50 MG 24 hr tablet   DOE (dyspnea on exertion)   Relevant Medications   flecainide  (TAMBOCOR ) 50 MG tablet    metoprolol  succinate (TOPROL  XL) 50 MG 24 hr tablet   Other Visit Diagnoses  SVT (supraventricular tachycardia) (HCC)    -  Primary   LVEF normal, grade 2 diastolic dysfunction, mild MR, stress test normal, no ischaemia, by monitor SVT 150/min, add flecanie 50 bid, increase metoprolol  to 50.   Relevant Medications   flecainide  (TAMBOCOR ) 50 MG tablet   metoprolol  succinate (TOPROL  XL) 50 MG 24 hr tablet     Nonrheumatic mitral valve regurgitation       Relevant Medications   flecainide  (TAMBOCOR ) 50 MG tablet   metoprolol  succinate (TOPROL  XL) 50 MG 24 hr tablet     Palpitation       Relevant Medications   flecainide  (TAMBOCOR ) 50 MG tablet   metoprolol  succinate (TOPROL  XL) 50 MG 24 hr tablet     Other chest pain       Relevant Medications   flecainide  (TAMBOCOR ) 50 MG tablet   metoprolol  succinate (TOPROL  XL) 50 MG 24 hr tablet          Disposition:   Return in about 4 weeks (around 06/02/2024).    Total time spent: 30 minutes  Signed,  Denyse Bathe, MD  05/05/2024 11:11 AM    [facility]

## 2024-05-28 ENCOUNTER — Other Ambulatory Visit: Payer: Self-pay

## 2024-05-28 MED ORDER — LOSARTAN POTASSIUM-HCTZ 50-12.5 MG PO TABS: 1.0000 | ORAL_TABLET | Freq: Every day | ORAL | 0 refills | Status: DC

## 2024-05-28 NOTE — Telephone Encounter (Signed)
 Rx sent electronically.

## 2024-06-02 ENCOUNTER — Encounter: Payer: Self-pay | Admitting: Cardiovascular Disease

## 2024-06-02 ENCOUNTER — Ambulatory Visit (INDEPENDENT_AMBULATORY_CARE_PROVIDER_SITE_OTHER): Admitting: Cardiovascular Disease

## 2024-06-02 VITALS — BP 108/62 | HR 56 | Ht 67.0 in | Wt 194.0 lb

## 2024-06-02 DIAGNOSIS — I340 Nonrheumatic mitral (valve) insufficiency: Secondary | ICD-10-CM | POA: Diagnosis not present

## 2024-06-02 DIAGNOSIS — E782 Mixed hyperlipidemia: Secondary | ICD-10-CM

## 2024-06-02 DIAGNOSIS — I471 Supraventricular tachycardia, unspecified: Secondary | ICD-10-CM | POA: Diagnosis not present

## 2024-06-02 DIAGNOSIS — E785 Hyperlipidemia, unspecified: Secondary | ICD-10-CM | POA: Diagnosis not present

## 2024-06-02 DIAGNOSIS — I10 Essential (primary) hypertension: Secondary | ICD-10-CM

## 2024-06-02 DIAGNOSIS — R002 Palpitations: Secondary | ICD-10-CM

## 2024-06-02 MED ORDER — FLECAINIDE ACETATE 100 MG PO TABS: 100.0000 mg | ORAL_TABLET | Freq: Two times a day (BID) | ORAL | 11 refills | Status: AC

## 2024-06-02 NOTE — Progress Notes (Signed)
   06/02/2024  Patient ID: Harold Li, male   DOB: 28-Oct-1960, 63 y.o.   MRN: [id number]  Pharmacy Quality Measure Review  This patient is appearing on a report for being at risk of failing the adherence measure for cholesterol (statin) medications this calendar year.   Medication: Rosuvastatin Last fill date: 04/22/24 for 90 day supply  Insurance report was not up to date. No action needed at this time.   Jon VEAR [place], PharmD Clinical Pharmacist [phone]

## 2024-06-02 NOTE — Progress Notes (Signed)
 Cardiology Office Note   Date:  06/02/2024   ID:  Martell, Mcfadyen June 19, 1961, MRN [id number]  PCP:  Jimmy Charlie FERNS, MD  Cardiologist:  Denyse Bathe, MD      History of Present Illness: Harold Li is a 63 y.o. male who presents for  Chief Complaint  Patient presents with   Follow-up    4 week follow up    Has back pain since yesterday. Still has palpitatioins.      Past Medical History:  Diagnosis Date   Depression    GERD (gastroesophageal reflux disease)    History of nephrolithiasis    Dr. Nicholaus   Hyperlipidemia    Hypertension    Lumbar pain      Past Surgical History:  Procedure Laterality Date   COLONOSCOPY     PARTIAL NEPHRECTOMY  09/11/2009   for oncocytoma     Current Outpatient Medications  Medication Sig Dispense Refill   flecainide  (TAMBOCOR ) 100 MG tablet Take 1 tablet (100 mg total) by mouth 2 (two) times daily. 60 tablet 11   losartan -hydrochlorothiazide (HYZAAR) 50-12.5 MG tablet Take 1 tablet by mouth daily. 30 tablet 0   metoprolol  succinate (TOPROL  XL) 50 MG 24 hr tablet Take 1 tablet (50 mg total) by mouth daily. Take with or immediately following a meal. 30 tablet 11   Multiple Vitamin (MULTI-VITAMIN PO) Take by mouth.     rosuvastatin (CRESTOR) 10 MG tablet TAKE 1 TABLET BY MOUTH EVERY DAY 90 tablet 0   TOLAK 4 % CREA Apply 4 % topically as needed.     No current facility-administered medications for this visit.    Allergies:   Citalopram hydrobromide    Social History:   reports that he quit smoking about 14 years ago. His smoking use included cigarettes. He has never used smokeless tobacco. He reports current alcohol use. He reports that he does not use drugs.   Family History:  family history includes Arthritis in his father and mother; Blindness in his father; Coronary artery disease in his father; Diabetes in his mother; Stroke in his father and mother.    ROS:     Review of Systems  Constitutional: Negative.    HENT: Negative.    Eyes: Negative.   Respiratory: Negative.    Gastrointestinal: Negative.   Genitourinary: Negative.   Musculoskeletal: Negative.   Skin: Negative.   Neurological: Negative.   Endo/Heme/Allergies: Negative.   Psychiatric/Behavioral: Negative.    All other systems reviewed and are negative.     All other systems are reviewed and negative.    PHYSICAL EXAM: VS:  BP 108/62   Pulse (!) 56   Ht 5' 7 (1.702 m)   Wt 194 lb (88 kg)   SpO2 98%   BMI 30.38 kg/m  , BMI Body mass index is 30.38 kg/m. Last weight:  Wt Readings from Last 3 Encounters:  06/02/24 194 lb (88 kg)  05/05/24 197 lb (89.4 kg)  04/01/24 195 lb 3.2 oz (88.5 kg)     Physical Exam Vitals reviewed.  Constitutional:      Appearance: Normal appearance. He is normal weight.  HENT:     Head: Normocephalic.     Nose: Nose normal.     Mouth/Throat:     Mouth: Mucous membranes are moist.  Eyes:     Pupils: Pupils are equal, round, and reactive to light.  Cardiovascular:     Rate and Rhythm: Normal rate and regular rhythm.  Pulses: Normal pulses.     Heart sounds: Normal heart sounds.  Pulmonary:     Effort: Pulmonary effort is normal.  Abdominal:     General: Abdomen is flat. Bowel sounds are normal.  Musculoskeletal:        General: Normal range of motion.     Cervical back: Normal range of motion.  Skin:    General: Skin is warm.  Neurological:     General: No focal deficit present.     Mental Status: He is alert.  Psychiatric:        Mood and Affect: Mood normal.       EKG: nsr no sig qtc change 383, hr 54  Recent Labs: 03/12/2024: ALT 31; BUN 23; Creatinine, Ser 1.29; Hemoglobin 13.7; Platelets 244; Potassium 4.7; Sodium 141    Lipid Panel    Component Value Date/Time   CHOL 144 03/12/2024 0830   TRIG 80 03/12/2024 0830   HDL 51 03/12/2024 0830   CHOLHDL 2.8 03/12/2024 0830   CHOLHDL 3 07/10/2022 1607   VLDL 20.6 07/10/2022 1607   LDLCALC 78 03/12/2024 0830    LDLDIRECT 164.5 06/02/2011 0824      Other studies Reviewed: Additional studies/ records that were reviewed today include:  Review of the above records demonstrates:       No data to display            ASSESSMENT AND PLAN:    ICD-10-CM   1. Mixed hyperlipidemia  E78.2 flecainide  (TAMBOCOR ) 100 MG tablet    2. Benign essential HTN  I10 flecainide  (TAMBOCOR ) 100 MG tablet    3. Hyperlipidemia, unspecified hyperlipidemia type  E78.5 flecainide  (TAMBOCOR ) 100 MG tablet    4. Nonrheumatic mitral valve regurgitation  I34.0 flecainide  (TAMBOCOR ) 100 MG tablet    5. Palpitation  R00.2 flecainide  (TAMBOCOR ) 100 MG tablet   still has palpitation, thus will change flecanide 100 bid.    6. SVT (supraventricular tachycardia) (HCC)  I47.10 flecainide  (TAMBOCOR ) 100 MG tablet   Had CRI but now better 1.29 creat, normal 1.27, as had renal tumor removed.       Problem List Items Addressed This Visit       Cardiovascular and Mediastinum   Benign essential HTN   Relevant Medications   flecainide  (TAMBOCOR ) 100 MG tablet     Other   Hyperlipidemia - Primary   Relevant Medications   flecainide  (TAMBOCOR ) 100 MG tablet   Other Visit Diagnoses       Nonrheumatic mitral valve regurgitation       Relevant Medications   flecainide  (TAMBOCOR ) 100 MG tablet     Palpitation       still has palpitation, thus will change flecanide 100 bid.   Relevant Medications   flecainide  (TAMBOCOR ) 100 MG tablet     SVT (supraventricular tachycardia) (HCC)       Had CRI but now better 1.29 creat, normal 1.27, as had renal tumor removed.   Relevant Medications   flecainide  (TAMBOCOR ) 100 MG tablet          Disposition:   Return in about 4 weeks (around 06/30/2024).    Total time spent: 35 minutes  Signed,  Denyse Bathe, MD  06/02/2024 10:08 AM    [facility]

## 2024-06-05 DIAGNOSIS — H2513 Age-related nuclear cataract, bilateral: Secondary | ICD-10-CM | POA: Diagnosis not present

## 2024-06-05 DIAGNOSIS — H5203 Hypermetropia, bilateral: Secondary | ICD-10-CM | POA: Diagnosis not present

## 2024-06-05 DIAGNOSIS — H52223 Regular astigmatism, bilateral: Secondary | ICD-10-CM | POA: Diagnosis not present

## 2024-06-05 DIAGNOSIS — H524 Presbyopia: Secondary | ICD-10-CM | POA: Diagnosis not present

## 2024-06-06 DIAGNOSIS — M5416 Radiculopathy, lumbar region: Secondary | ICD-10-CM | POA: Diagnosis not present

## 2024-06-10 DIAGNOSIS — M5416 Radiculopathy, lumbar region: Secondary | ICD-10-CM | POA: Diagnosis not present

## 2024-06-12 ENCOUNTER — Encounter: Payer: Self-pay | Admitting: Cardiovascular Disease

## 2024-06-13 DIAGNOSIS — M5416 Radiculopathy, lumbar region: Secondary | ICD-10-CM | POA: Diagnosis not present

## 2024-06-18 DIAGNOSIS — M5416 Radiculopathy, lumbar region: Secondary | ICD-10-CM | POA: Diagnosis not present

## 2024-06-20 ENCOUNTER — Other Ambulatory Visit

## 2024-06-20 DIAGNOSIS — E782 Mixed hyperlipidemia: Secondary | ICD-10-CM | POA: Diagnosis not present

## 2024-06-21 LAB — CBC WITH DIFF/PLATELET
Basophils Absolute: 0.1 x10E3/uL (ref 0.0–0.2)
Basos: 1 %
EOS (ABSOLUTE): 0.4 x10E3/uL (ref 0.0–0.4)
Eos: 4 %
Hematocrit: 45.2 % (ref 37.5–51.0)
Hemoglobin: 15 g/dL (ref 13.0–17.7)
Immature Grans (Abs): 0 x10E3/uL (ref 0.0–0.1)
Immature Granulocytes: 0 %
Lymphocytes Absolute: 2 x10E3/uL (ref 0.7–3.1)
Lymphs: 23 %
MCH: 30.1 pg (ref 26.6–33.0)
MCHC: 33.2 g/dL (ref 31.5–35.7)
MCV: 91 fL (ref 79–97)
Monocytes Absolute: 0.6 x10E3/uL (ref 0.1–0.9)
Monocytes: 7 %
Neutrophils Absolute: 5.7 x10E3/uL (ref 1.4–7.0)
Neutrophils: 65 %
Platelets: 288 x10E3/uL (ref 150–450)
RBC: 4.98 x10E6/uL (ref 4.14–5.80)
RDW: 13 % (ref 11.6–15.4)
WBC: 8.8 x10E3/uL (ref 3.4–10.8)

## 2024-06-21 LAB — COMPREHENSIVE METABOLIC PANEL WITH GFR
ALT: 36 IU/L (ref 0–44)
AST: 26 IU/L (ref 0–40)
Albumin: 4.4 g/dL (ref 3.9–4.9)
Alkaline Phosphatase: 80 IU/L (ref 47–123)
BUN/Creatinine Ratio: 22 (ref 10–24)
BUN: 28 mg/dL — ABNORMAL HIGH (ref 8–27)
Bilirubin Total: 0.3 mg/dL (ref 0.0–1.2)
CO2: 21 mmol/L (ref 20–29)
Calcium: 9.4 mg/dL (ref 8.6–10.2)
Chloride: 106 mmol/L (ref 96–106)
Creatinine, Ser: 1.28 mg/dL — ABNORMAL HIGH (ref 0.76–1.27)
Globulin, Total: 2.3 g/dL (ref 1.5–4.5)
Glucose: 95 mg/dL (ref 70–99)
Potassium: 5.1 mmol/L (ref 3.5–5.2)
Sodium: 145 mmol/L — ABNORMAL HIGH (ref 134–144)
Total Protein: 6.7 g/dL (ref 6.0–8.5)
eGFR: 62 mL/min/1.73 (ref >59)

## 2024-06-21 LAB — LIPID PANEL
Chol/HDL Ratio: 2.5 ratio (ref 0.0–5.0)
Cholesterol, Total: 184 mg/dL (ref 100–199)
HDL: 73 mg/dL (ref >39)
LDL Chol Calc (NIH): 99 mg/dL (ref 0–99)
Triglycerides: 65 mg/dL (ref 0–149)
VLDL Cholesterol Cal: 12 mg/dL (ref 5–40)

## 2024-06-23 DIAGNOSIS — M5416 Radiculopathy, lumbar region: Secondary | ICD-10-CM | POA: Diagnosis not present

## 2024-06-25 ENCOUNTER — Ambulatory Visit (INDEPENDENT_AMBULATORY_CARE_PROVIDER_SITE_OTHER): Admitting: Internal Medicine

## 2024-06-25 ENCOUNTER — Encounter: Payer: Self-pay | Admitting: Internal Medicine

## 2024-06-25 ENCOUNTER — Ambulatory Visit: Payer: Self-pay | Admitting: Internal Medicine

## 2024-06-25 VITALS — BP 126/68 | HR 59 | Temp 97.9°F | Ht 67.0 in | Wt 195.6 lb

## 2024-06-25 DIAGNOSIS — J301 Allergic rhinitis due to pollen: Secondary | ICD-10-CM

## 2024-06-25 DIAGNOSIS — E870 Hyperosmolality and hypernatremia: Secondary | ICD-10-CM

## 2024-06-25 DIAGNOSIS — R7989 Other specified abnormal findings of blood chemistry: Secondary | ICD-10-CM | POA: Diagnosis not present

## 2024-06-25 DIAGNOSIS — M545 Low back pain, unspecified: Secondary | ICD-10-CM | POA: Diagnosis not present

## 2024-06-25 DIAGNOSIS — I10 Essential (primary) hypertension: Secondary | ICD-10-CM | POA: Diagnosis not present

## 2024-06-25 DIAGNOSIS — K219 Gastro-esophageal reflux disease without esophagitis: Secondary | ICD-10-CM

## 2024-06-25 DIAGNOSIS — Z0001 Encounter for general adult medical examination with abnormal findings: Secondary | ICD-10-CM | POA: Diagnosis not present

## 2024-06-25 DIAGNOSIS — E782 Mixed hyperlipidemia: Secondary | ICD-10-CM

## 2024-06-25 MED ORDER — FLUTICASONE PROPIONATE 50 MCG/ACT NA SUSP: 1.0000 | Freq: Every day | NASAL | 2 refills | Status: DC

## 2024-06-25 MED ORDER — CETIRIZINE HCL 10 MG PO TABS: 10.0000 mg | ORAL_TABLET | Freq: Every day | ORAL | 2 refills | Status: DC

## 2024-06-25 NOTE — Addendum Note (Signed)
 Addended by: BRYCE MARKER AHMAD on: 06/25/2024 09:45 AM   Modules accepted: Orders

## 2024-06-25 NOTE — Progress Notes (Addendum)
 Established Patient Office Visit  Subjective:  Patient ID: Harold Li, male    DOB: 11-Mar-1961  Age: 63 y.o. MRN: [id number]  Chief Complaint  Patient presents with   Annual Exam    AWV, CPE and lab results    Here for CPE, lab review and medication refills. Back pain has improved with epidural injection. Labs reviewed and notable for stable renal function but admits to poor fluid intake, elevated sodium, azotemia while TC remains adequately controlled with normal CBC. C/o flare of seasonal allergies.    No other concerns at this time.   Past Medical History:  Diagnosis Date   Depression    GERD (gastroesophageal reflux disease)    History of nephrolithiasis    Dr. Nicholaus   Hyperlipidemia    Hypertension    Lumbar pain     Past Surgical History:  Procedure Laterality Date   COLONOSCOPY     PARTIAL NEPHRECTOMY  09/11/2009   for oncocytoma    Social History   Socioeconomic History   Marital status: Married    Spouse name: Not on file   Number of children: Not on file   Years of education: Not on file   Highest education level: Not on file  Occupational History   Occupation:  [employer]    Comment:  Control and instrumentation engineer supply  Tobacco Use   Smoking status: Former    Current packs/day: 0.00    Types: Cigarettes    Quit date: 08/11/2009    Years since quitting: 14.8   Smokeless tobacco: Never  Vaping Use   Vaping status: Never Used  Substance and Sexual Activity   Alcohol use: Yes    Comment: extrememly rare about twice yearly   Drug use: No   Sexual activity: Not on file  Other Topics Concern   Not on file  Social History Narrative   Married 1/15   Social Drivers of Health   Financial Resource Strain: Not on file  Food Insecurity: Not on file  Transportation Needs: Not on file  Physical Activity: Not on file  Stress: Not on file  Social Connections: Not on file  Intimate Partner Violence: Not on file    Family History   Problem Relation Age of Onset   Diabetes Mother    Arthritis Mother    Stroke Mother    Coronary artery disease Father    Arthritis Father    Stroke Father        in eye   Blindness Father    Colon cancer Neg Hx    Pancreatic cancer Neg Hx    Rectal cancer Neg Hx    Stomach cancer Neg Hx    Colon polyps Neg Hx    Esophageal cancer Neg Hx     Allergies  Allergen Reactions   Citalopram Hydrobromide     REACTION: no energy, weak    Outpatient Medications Prior to Visit  Medication Sig   acyclovir (ZOVIRAX) 200 MG capsule Take 200 mg by mouth as needed.   flecainide  (TAMBOCOR ) 100 MG tablet Take 1 tablet (100 mg total) by mouth 2 (two) times daily.   losartan -hydrochlorothiazide (HYZAAR) 50-12.5 MG tablet Take 1 tablet by mouth daily.   metoprolol  succinate (TOPROL  XL) 50 MG 24 hr tablet Take 1 tablet (50 mg total) by mouth daily. Take with or immediately following a meal.   Multiple Vitamin (MULTI-VITAMIN PO) Take by mouth.   rosuvastatin (CRESTOR) 10 MG tablet TAKE 1 TABLET BY MOUTH EVERY  DAY   TOLAK 4 % CREA Apply 4 % topically as needed.   No facility-administered medications prior to visit.    Review of Systems  Constitutional:  Positive for weight loss (2 lbs).  HENT:  Positive for congestion.        Postnasal drip  Eyes: Negative.   Respiratory: Negative.    Cardiovascular:  Positive for palpitations. Negative for chest pain.  Gastrointestinal:  Positive for heartburn.  Genitourinary:  Negative for frequency.  Musculoskeletal:  Positive for back pain.  Neurological:  Negative for tingling.  Endo/Heme/Allergies:  Positive for environmental allergies.  Psychiatric/Behavioral:  Positive for depression. The patient does not have insomnia.        Objective:   BP 126/68   Pulse (!) 59   Temp 97.9 F (36.6 C)   Ht 5' 7 (1.702 m)   Wt 195 lb 9.6 oz (88.7 kg)   SpO2 98%   BMI 30.64 kg/m   Vitals:   06/25/24 0843  BP: 126/68  Pulse: (!) 59  Temp: 97.9  F (36.6 C)  Height: 5' 7 (1.702 m)  Weight: 195 lb 9.6 oz (88.7 kg)  SpO2: 98%  BMI (Calculated): 30.63    Physical Exam Vitals reviewed.  Constitutional:      Appearance: Normal appearance. He is normal weight.  HENT:     Head: Normocephalic.     Nose: Nose normal.     Mouth/Throat:     Mouth: Mucous membranes are moist.  Eyes:     Pupils: Pupils are equal, round, and reactive to light.  Cardiovascular:     Rate and Rhythm: Normal rate and regular rhythm.     Pulses: Normal pulses.     Heart sounds: Normal heart sounds.  Pulmonary:     Effort: Pulmonary effort is normal.  Abdominal:     General: Abdomen is flat. Bowel sounds are normal.  Musculoskeletal:        General: Normal range of motion.     Cervical back: Normal range of motion.  Skin:    General: Skin is warm.  Neurological:     General: No focal deficit present.     Mental Status: He is alert.  Psychiatric:        Mood and Affect: Mood normal.      No results found for any visits on 06/25/24.  Recent Results (from the past 2160 hours)  Comprehensive metabolic panel with GFR     Status: Abnormal   Collection Time: 06/20/24  8:34 AM  Result Value Ref Range   Glucose 95 70 - 99 mg/dL   BUN 28 (H) 8 - 27 mg/dL   Creatinine, Ser 8.71 (H) 0.76 - 1.27 mg/dL   eGFR 62 >40 fO/fpw/8.26   BUN/Creatinine Ratio 22 10 - 24   Sodium 145 (H) 134 - 144 mmol/L   Potassium 5.1 3.5 - 5.2 mmol/L   Chloride 106 96 - 106 mmol/L   CO2 21 20 - 29 mmol/L   Calcium 9.4 8.6 - 10.2 mg/dL   Total Protein 6.7 6.0 - 8.5 g/dL   Albumin 4.4 3.9 - 4.9 g/dL   Globulin, Total 2.3 1.5 - 4.5 g/dL   Bilirubin Total 0.3 0.0 - 1.2 mg/dL   Alkaline Phosphatase 80 47 - 123 IU/L   AST 26 0 - 40 IU/L   ALT 36 0 - 44 IU/L  Lipid panel     Status: None   Collection Time: 06/20/24  8:34 AM  Result Value  Ref Range   Cholesterol, Total 184 100 - 199 mg/dL   Triglycerides 65 0 - 149 mg/dL   HDL 73 >60 mg/dL   VLDL Cholesterol Cal 12 5 -  40 mg/dL   LDL Chol Calc (NIH) 99 0 - 99 mg/dL   Chol/HDL Ratio 2.5 0.0 - 5.0 ratio    Comment:                                   T. Chol/HDL Ratio                                             Men  Women                               1/2 Avg.Risk  3.4    3.3                                   Avg.Risk  5.0    4.4                                2X Avg.Risk  9.6    7.1                                3X Avg.Risk 23.4   11.0   CBC With Diff/Platelet     Status: None   Collection Time: 06/20/24  8:34 AM  Result Value Ref Range   WBC 8.8 3.4 - 10.8 x10E3/uL   RBC 4.98 4.14 - 5.80 x10E6/uL   Hemoglobin 15.0 13.0 - 17.7 g/dL   Hematocrit 54.7 62.4 - 51.0 %   MCV 91 79 - 97 fL   MCH 30.1 26.6 - 33.0 pg   MCHC 33.2 31.5 - 35.7 g/dL   RDW 86.9 88.3 - 84.5 %   Platelets 288 150 - 450 x10E3/uL   Neutrophils 65 Not Estab. %   Lymphs 23 Not Estab. %   Monocytes 7 Not Estab. %   Eos 4 Not Estab. %   Basos 1 Not Estab. %   Neutrophils Absolute 5.7 1.4 - 7.0 x10E3/uL   Lymphocytes Absolute 2.0 0.7 - 3.1 x10E3/uL   Monocytes Absolute 0.6 0.1 - 0.9 x10E3/uL   EOS (ABSOLUTE) 0.4 0.0 - 0.4 x10E3/uL   Basophils Absolute 0.1 0.0 - 0.2 x10E3/uL   Immature Granulocytes 0 Not Estab. %   Immature Grans (Abs) 0.0 0.0 - 0.1 x10E3/uL      Assessment & Plan:  As per problem list  Problem List Items Addressed This Visit       Cardiovascular and Mediastinum   Benign essential HTN     Digestive   GERD (gastroesophageal reflux disease)     Other   Hyperlipidemia   Lumbar pain   Other Visit Diagnoses       Encounter for general adult medical examination with abnormal findings    -  Primary     Hypernatremia         Azotemia  Return in about 3 months (around 09/25/2024) for fu with labs prior.   Total time spent: 40 minutes  Sherrill Cinderella Perry, MD  06/25/2024   This document may have been prepared by [facility] Voice Recognition software and as such may include unintentional  dictation errors.

## 2024-06-28 ENCOUNTER — Other Ambulatory Visit: Payer: Self-pay | Admitting: Internal Medicine

## 2024-06-30 DIAGNOSIS — M5416 Radiculopathy, lumbar region: Secondary | ICD-10-CM | POA: Diagnosis not present

## 2024-07-04 ENCOUNTER — Encounter: Payer: Self-pay | Admitting: Cardiovascular Disease

## 2024-07-04 ENCOUNTER — Ambulatory Visit: Admitting: Cardiovascular Disease

## 2024-07-04 VITALS — BP 122/64 | HR 61 | Ht 67.0 in | Wt 199.0 lb

## 2024-07-04 DIAGNOSIS — I471 Supraventricular tachycardia, unspecified: Secondary | ICD-10-CM | POA: Diagnosis not present

## 2024-07-04 DIAGNOSIS — I10 Essential (primary) hypertension: Secondary | ICD-10-CM

## 2024-07-04 DIAGNOSIS — I340 Nonrheumatic mitral (valve) insufficiency: Secondary | ICD-10-CM | POA: Diagnosis not present

## 2024-07-04 DIAGNOSIS — E785 Hyperlipidemia, unspecified: Secondary | ICD-10-CM | POA: Diagnosis not present

## 2024-07-04 DIAGNOSIS — R0609 Other forms of dyspnea: Secondary | ICD-10-CM | POA: Diagnosis not present

## 2024-07-04 DIAGNOSIS — R002 Palpitations: Secondary | ICD-10-CM

## 2024-07-04 NOTE — Progress Notes (Signed)
 Cardiology Office Note   Date:  07/04/2024   ID:  Vivian, Okelley 10-19-1960, MRN [id number]  PCP:  Albina GORMAN Dine, MD  Cardiologist:  Denyse Bathe, MD      History of Present Illness: Harold Li is a 63 y.o. male who presents for  Chief Complaint  Patient presents with   Follow-up    4 week follow up    No further palpitation, after higher dose sotolol.      Past Medical History:  Diagnosis Date   Depression    GERD (gastroesophageal reflux disease)    History of nephrolithiasis    Dr. Nicholaus   Hyperlipidemia    Hypertension    Lumbar pain      Past Surgical History:  Procedure Laterality Date   COLONOSCOPY     PARTIAL NEPHRECTOMY  09/11/2009   for oncocytoma     Current Outpatient Medications  Medication Sig Dispense Refill   acyclovir (ZOVIRAX) 200 MG capsule Take 200 mg by mouth as needed.     cetirizine (ZYRTEC ALLERGY) 10 MG tablet Take 1 tablet (10 mg total) by mouth daily. 30 tablet 2   flecainide  (TAMBOCOR ) 100 MG tablet Take 1 tablet (100 mg total) by mouth 2 (two) times daily. 60 tablet 11   fluticasone  (FLONASE ) 50 MCG/ACT nasal spray Place 1 spray into both nostrils daily. 16 mL 2   losartan -hydrochlorothiazide (HYZAAR) 50-12.5 MG tablet TAKE 1 TABLET BY MOUTH EVERY DAY 90 tablet 1   metoprolol  succinate (TOPROL  XL) 50 MG 24 hr tablet Take 1 tablet (50 mg total) by mouth daily. Take with or immediately following a meal. 30 tablet 11   Multiple Vitamin (MULTI-VITAMIN PO) Take by mouth.     rosuvastatin (CRESTOR) 10 MG tablet TAKE 1 TABLET BY MOUTH EVERY DAY 90 tablet 0   TOLAK 4 % CREA Apply 4 % topically as needed.     No current facility-administered medications for this visit.    Allergies:   Citalopram hydrobromide    Social History:   reports that he quit smoking about 14 years ago. His smoking use included cigarettes. He has never used smokeless tobacco. He reports current alcohol use. He reports that he does not use drugs.    Family History:  family history includes Arthritis in his father and mother; Blindness in his father; Coronary artery disease in his father; Diabetes in his mother; Stroke in his father and mother.    ROS:     Review of Systems  Constitutional: Negative.   HENT: Negative.    Eyes: Negative.   Respiratory: Negative.    Gastrointestinal: Negative.   Genitourinary: Negative.   Musculoskeletal: Negative.   Skin: Negative.   Neurological: Negative.   Endo/Heme/Allergies: Negative.   Psychiatric/Behavioral: Negative.    All other systems reviewed and are negative.     All other systems are reviewed and negative.    PHYSICAL EXAM: VS:  BP 122/64   Pulse 61   Ht 5' 7 (1.702 m)   Wt 199 lb (90.3 kg)   SpO2 95%   BMI 31.17 kg/m  , BMI Body mass index is 31.17 kg/m. Last weight:  Wt Readings from Last 3 Encounters:  07/04/24 199 lb (90.3 kg)  06/25/24 195 lb 9.6 oz (88.7 kg)  06/02/24 194 lb (88 kg)     Physical Exam Vitals reviewed.  Constitutional:      Appearance: Normal appearance. He is normal weight.  HENT:  Head: Normocephalic.     Nose: Nose normal.     Mouth/Throat:     Mouth: Mucous membranes are moist.  Eyes:     Pupils: Pupils are equal, round, and reactive to light.  Cardiovascular:     Rate and Rhythm: Normal rate and regular rhythm.     Pulses: Normal pulses.     Heart sounds: Normal heart sounds.  Pulmonary:     Effort: Pulmonary effort is normal.  Abdominal:     General: Abdomen is flat. Bowel sounds are normal.  Musculoskeletal:        General: Normal range of motion.     Cervical back: Normal range of motion.  Skin:    General: Skin is warm.  Neurological:     General: No focal deficit present.     Mental Status: He is alert.  Psychiatric:        Mood and Affect: Mood normal.       EKG:   Recent Labs: 06/20/2024: ALT 36; BUN 28; Creatinine, Ser 1.28; Hemoglobin 15.0; Platelets 288; Potassium 5.1; Sodium 145    Lipid  Panel    Component Value Date/Time   CHOL 184 06/20/2024 0834   TRIG 65 06/20/2024 0834   HDL 73 06/20/2024 0834   CHOLHDL 2.5 06/20/2024 0834   CHOLHDL 3 07/10/2022 1607   VLDL 20.6 07/10/2022 1607   LDLCALC 99 06/20/2024 0834   LDLDIRECT 164.5 06/02/2011 0824      Other studies Reviewed: Additional studies/ records that were reviewed today include:  Review of the above records demonstrates:       No data to display            ASSESSMENT AND PLAN:    ICD-10-CM   1. Benign essential HTN  I10     2. Hyperlipidemia, unspecified hyperlipidemia type  E78.5     3. DOE (dyspnea on exertion)  R06.09     4. Nonrheumatic mitral valve regurgitation  I34.0     5. Palpitation  R00.2     6. SVT (supraventricular tachycardia)  I47.10    on flecanide 100 bid, feels better       Problem List Items Addressed This Visit       Cardiovascular and Mediastinum   Benign essential HTN - Primary     Other   Hyperlipidemia   DOE (dyspnea on exertion)   Other Visit Diagnoses       Nonrheumatic mitral valve regurgitation         Palpitation         SVT (supraventricular tachycardia)       on flecanide 100 bid, feels better          Disposition:   Return in about 3 months (around 10/04/2024).    Total time spent: 35 minutes  Signed,  Denyse Bathe, MD  07/04/2024 10:12 AM    [facility]

## 2024-07-08 DIAGNOSIS — L578 Other skin changes due to chronic exposure to nonionizing radiation: Secondary | ICD-10-CM | POA: Diagnosis not present

## 2024-07-08 DIAGNOSIS — L821 Other seborrheic keratosis: Secondary | ICD-10-CM | POA: Diagnosis not present

## 2024-07-08 DIAGNOSIS — L570 Actinic keratosis: Secondary | ICD-10-CM | POA: Diagnosis not present

## 2024-07-15 NOTE — Progress Notes (Signed)
   07/15/2024  Patient ID: Harold Li, male   DOB: Oct 13, 1960, 63 y.o.   MRN: [id number]  Pharmacy Quality Measure Review  This patient is appearing on a report for being at risk of failing the adherence measure for hypertension (ACEi/ARB) medications this calendar year.   Medication: Losartan -HCTZ Last fill date: 06/30/24 for 90 day supply  Insurance report was not up to date. No action needed at this time.   Jon VEAR Lindau, PharmD Clinical Pharmacist [phone]

## 2024-07-22 ENCOUNTER — Other Ambulatory Visit: Payer: Self-pay | Admitting: Cardiology

## 2024-07-22 DIAGNOSIS — E782 Mixed hyperlipidemia: Secondary | ICD-10-CM

## 2024-08-13 NOTE — Progress Notes (Signed)
   08/13/2024  Patient ID: Harold Li, male   DOB: 08-28-1961, 63 y.o.   MRN: [id number]  Pharmacy Quality Measure Review  This patient is appearing on a report for being at risk of failing the adherence measure for cholesterol (statin) medications this calendar year.   Medication: Rosuvastatin Last fill date: 07/22/24 for 90 day supply  Insurance report was not up to date. No action needed at this time.   Jon VEAR Lindau, PharmD Clinical Pharmacist [phone]

## 2024-09-19 ENCOUNTER — Other Ambulatory Visit: Payer: Self-pay | Admitting: Internal Medicine

## 2024-09-19 DIAGNOSIS — J301 Allergic rhinitis due to pollen: Secondary | ICD-10-CM

## 2024-09-26 ENCOUNTER — Other Ambulatory Visit

## 2024-09-26 DIAGNOSIS — E782 Mixed hyperlipidemia: Secondary | ICD-10-CM

## 2024-09-27 LAB — LIPID PANEL
Chol/HDL Ratio: 2.8 ratio (ref 0.0–5.0)
Cholesterol, Total: 175 mg/dL (ref 100–199)
HDL: 62 mg/dL
LDL Chol Calc (NIH): 101 mg/dL — ABNORMAL HIGH (ref 0–99)
Triglycerides: 60 mg/dL (ref 0–149)
VLDL Cholesterol Cal: 12 mg/dL (ref 5–40)

## 2024-09-27 LAB — COMPREHENSIVE METABOLIC PANEL WITH GFR
ALT: 25 IU/L (ref 0–44)
AST: 32 IU/L (ref 0–40)
Albumin: 4.3 g/dL (ref 3.9–4.9)
Alkaline Phosphatase: 65 IU/L (ref 47–123)
BUN/Creatinine Ratio: 18 (ref 10–24)
BUN: 25 mg/dL (ref 8–27)
Bilirubin Total: 0.5 mg/dL (ref 0.0–1.2)
CO2: 22 mmol/L (ref 20–29)
Calcium: 9.3 mg/dL (ref 8.6–10.2)
Chloride: 102 mmol/L (ref 96–106)
Creatinine, Ser: 1.4 mg/dL — ABNORMAL HIGH (ref 0.76–1.27)
Globulin, Total: 2.1 g/dL (ref 1.5–4.5)
Glucose: 90 mg/dL (ref 70–99)
Potassium: 4.3 mmol/L (ref 3.5–5.2)
Sodium: 140 mmol/L (ref 134–144)
Total Protein: 6.4 g/dL (ref 6.0–8.5)
eGFR: 56 mL/min/1.73 — ABNORMAL LOW

## 2024-09-27 LAB — CK: Total CK: 390 U/L — ABNORMAL HIGH (ref 41–331)

## 2024-10-01 ENCOUNTER — Ambulatory Visit (INDEPENDENT_AMBULATORY_CARE_PROVIDER_SITE_OTHER): Admitting: Internal Medicine

## 2024-10-01 ENCOUNTER — Ambulatory Visit: Payer: Self-pay | Admitting: Internal Medicine

## 2024-10-01 VITALS — BP 124/68 | HR 54 | Temp 97.9°F | Ht 67.0 in | Wt 192.4 lb

## 2024-10-01 DIAGNOSIS — I10 Essential (primary) hypertension: Secondary | ICD-10-CM | POA: Diagnosis not present

## 2024-10-01 DIAGNOSIS — B353 Tinea pedis: Secondary | ICD-10-CM

## 2024-10-01 DIAGNOSIS — E782 Mixed hyperlipidemia: Secondary | ICD-10-CM

## 2024-10-01 DIAGNOSIS — N1831 Chronic kidney disease, stage 3a: Secondary | ICD-10-CM

## 2024-10-01 DIAGNOSIS — E870 Hyperosmolality and hypernatremia: Secondary | ICD-10-CM

## 2024-10-01 MED ORDER — AMLODIPINE-OLMESARTAN 5-20 MG PO TABS: 1.0000 | ORAL_TABLET | Freq: Every day | ORAL | 2 refills | Status: AC

## 2024-10-01 MED ORDER — TERBINAFINE HCL 250 MG PO TABS: 250.0000 mg | ORAL_TABLET | Freq: Every day | ORAL | 0 refills | Status: AC

## 2024-10-01 NOTE — Progress Notes (Signed)
 "  Established Patient Office Visit  Subjective:  Patient ID: Harold Li, male    DOB: 07-08-1961  Age: 64 y.o. MRN: [id number]  Chief Complaint  Patient presents with   Follow-up    3 month lab results     C/o bilateral athletes foot and also here for lab review and medication refills.     No other concerns at this time.   Past Medical History:  Diagnosis Date   Depression    GERD (gastroesophageal reflux disease)    History of nephrolithiasis    Dr. Nicholaus   Hyperlipidemia    Hypertension    Lumbar pain     Past Surgical History:  Procedure Laterality Date   COLONOSCOPY     PARTIAL NEPHRECTOMY  09/11/2009   for oncocytoma    Social History   Socioeconomic History   Marital status: Married    Spouse name: Not on file   Number of children: Not on file   Years of education: Not on file   Highest education level: Not on file  Occupational History   Occupation:  [employer]    Comment:  [employer]  Tobacco Use   Smoking status: Former    Current packs/day: 0.00    Types: Cigarettes    Quit date: 08/11/2009    Years since quitting: 15.1   Smokeless tobacco: Never  Vaping Use   Vaping status: Never Used  Substance and Sexual Activity   Alcohol use: Yes    Comment: extrememly rare about twice yearly   Drug use: No   Sexual activity: Not on file  Other Topics Concern   Not on file  Social History Narrative   Married 1/15   Social Drivers of Health   Tobacco Use: Medium Risk (07/04/2024)   Patient History    Smoking Tobacco Use: Former    Smokeless Tobacco Use: Never    Passive Exposure: Not on Actuary Strain: Not on file  Food Insecurity: Not on file  Transportation Needs: Not on file  Physical Activity: Not on file  Stress: Not on file  Social Connections: Not on file  Intimate Partner Violence: Not on file  Depression (PHQ2-9): Low Risk (06/25/2024)   Depression (PHQ2-9)    PHQ-2 Score: 1   Alcohol Screen: Not on file  Housing: Not on file  Utilities: Not on file  Health Literacy: Not on file    Family History  Problem Relation Age of Onset   Diabetes Mother    Arthritis Mother    Stroke Mother    Coronary artery disease Father    Arthritis Father    Stroke Father        in eye   Blindness Father    Colon cancer Neg Hx    Pancreatic cancer Neg Hx    Rectal cancer Neg Hx    Stomach cancer Neg Hx    Colon polyps Neg Hx    Esophageal cancer Neg Hx     Allergies[1]  Show/hide medication list[2]  Review of Systems  Constitutional:  Positive for weight loss (7 lbs).  HENT:  Positive for congestion.        Postnasal drip  Eyes: Negative.   Respiratory: Negative.    Cardiovascular:  Positive for palpitations. Negative for chest pain.  Gastrointestinal:  Positive for heartburn.  Genitourinary:  Negative for frequency.  Musculoskeletal:  Positive for back pain. Negative for myalgias.  Neurological:  Negative for tingling.  Endo/Heme/Allergies:  Positive for environmental allergies.  Psychiatric/Behavioral:  Positive for depression. The patient does not have insomnia.        Objective:   BP 124/68   Pulse (!) 54   Temp 97.9 F (36.6 C)   Ht 5' 7 (1.702 m)   Wt 192 lb 6.4 oz (87.3 kg)   SpO2 97%   BMI 30.13 kg/m   Vitals:   10/01/24 0824  BP: 124/68  Pulse: (!) 54  Temp: 97.9 F (36.6 C)  Height: 5' 7 (1.702 m)  Weight: 192 lb 6.4 oz (87.3 kg)  SpO2: 97%  BMI (Calculated): 30.13    Physical Exam Vitals reviewed.  Constitutional:      Appearance: Normal appearance. He is normal weight.  HENT:     Head: Normocephalic.     Nose: Nose normal.     Mouth/Throat:     Mouth: Mucous membranes are moist.  Eyes:     Pupils: Pupils are equal, round, and reactive to light.  Cardiovascular:     Rate and Rhythm: Normal rate and regular rhythm.     Pulses: Normal pulses.     Heart sounds: Normal heart sounds.  Pulmonary:     Effort: Pulmonary  effort is normal.  Abdominal:     General: Abdomen is flat. Bowel sounds are normal.  Musculoskeletal:        General: Normal range of motion.     Cervical back: Normal range of motion.  Skin:    General: Skin is warm.  Neurological:     General: No focal deficit present.     Mental Status: He is alert.  Psychiatric:        Mood and Affect: Mood normal.      No results found for any visits on 10/01/24.  Recent Results (from the past 2160 hours)  Comprehensive metabolic panel with GFR     Status: Abnormal   Collection Time: 09/26/24  8:21 AM  Result Value Ref Range   Glucose 90 70 - 99 mg/dL   BUN 25 8 - 27 mg/dL   Creatinine, Ser 8.59 (H) 0.76 - 1.27 mg/dL   eGFR 56 (L) >40 fO/fpw/8.26   BUN/Creatinine Ratio 18 10 - 24   Sodium 140 134 - 144 mmol/L   Potassium 4.3 3.5 - 5.2 mmol/L   Chloride 102 96 - 106 mmol/L   CO2 22 20 - 29 mmol/L   Calcium 9.3 8.6 - 10.2 mg/dL   Total Protein 6.4 6.0 - 8.5 g/dL   Albumin 4.3 3.9 - 4.9 g/dL   Globulin, Total 2.1 1.5 - 4.5 g/dL   Bilirubin Total 0.5 0.0 - 1.2 mg/dL   Alkaline Phosphatase 65 47 - 123 IU/L   AST 32 0 - 40 IU/L   ALT 25 0 - 44 IU/L  Lipid panel     Status: Abnormal   Collection Time: 09/26/24  8:21 AM  Result Value Ref Range   Cholesterol, Total 175 100 - 199 mg/dL   Triglycerides 60 0 - 149 mg/dL   HDL 62 >60 mg/dL   VLDL Cholesterol Cal 12 5 - 40 mg/dL   LDL Chol Calc (NIH) 898 (H) 0 - 99 mg/dL   Chol/HDL Ratio 2.8 0.0 - 5.0 ratio    Comment:                                   T. Chol/HDL Ratio  Men  Women                               1/2 Avg.Risk  3.4    3.3                                   Avg.Risk  5.0    4.4                                2X Avg.Risk  9.6    7.1                                3X Avg.Risk 23.4   11.0   CK     Status: Abnormal   Collection Time: 09/26/24  8:22 AM  Result Value Ref Range   Total CK 390 (H) 41 - 331 U/L      Assessment &  Plan:  Harold Li was seen today for follow-up.  Benign essential HTN -     amLODIPine -Olmesartan ; Take 1 tablet by mouth daily.  Dispense: 30 tablet; Refill: 2 -     Basic metabolic panel with GFR  Mixed hyperlipidemia  Hypernatremia  CKD stage 3a, GFR 45-59 ml/min (HCC)  Tinea pedis of both feet -     Terbinafine  HCl; Take 1 tablet (250 mg total) by mouth daily for 14 days.  Dispense: 14 tablet; Refill: 0    Problem List Items Addressed This Visit       Cardiovascular and Mediastinum   Benign essential HTN - Primary   Relevant Medications   amLODipine -olmesartan  (AZOR ) 5-20 MG tablet   Other Relevant Orders   Basic metabolic panel with GFR     Other   Hyperlipidemia   Relevant Medications   amLODipine -olmesartan  (AZOR ) 5-20 MG tablet   Other Visit Diagnoses       Hypernatremia         CKD stage 3a, GFR 45-59 ml/min (HCC)         Tinea pedis of both feet       Relevant Medications   terbinafine  (LAMISIL ) 250 MG tablet       Return in about 1 month (around 11/01/2024) for fu with labs prior.   Total time spent: 30 minutes. This time includes review of previous notes and results and patient face to face interaction during today'Harold Li visit.    Harold Cinderella Perry, MD  10/01/2024   This document may have been prepared by [facility] Voice Recognition software and as such may include unintentional dictation errors.     [1]  Allergies Allergen Reactions   Citalopram Hydrobromide     REACTION: no energy, weak  [2]  Outpatient Medications Prior to Visit  Medication Sig   cetirizine  (ZYRTEC ) 10 MG tablet TAKE 1 TABLET BY MOUTH EVERY DAY   flecainide  (TAMBOCOR ) 100 MG tablet Take 1 tablet (100 mg total) by mouth 2 (two) times daily.   metoprolol  succinate (TOPROL  XL) 50 MG 24 hr tablet Take 1 tablet (50 mg total) by mouth daily. Take with or immediately following a meal.   Multiple Vitamin (MULTI-VITAMIN PO) Take by mouth.   rosuvastatin (CRESTOR) 10 MG tablet TAKE 1  TABLET BY MOUTH EVERY DAY   TOLAK 4 % CREA Apply  4 % topically as needed.   [DISCONTINUED] losartan -hydrochlorothiazide (HYZAAR) 50-12.5 MG tablet TAKE 1 TABLET BY MOUTH EVERY DAY   acyclovir (ZOVIRAX) 200 MG capsule Take 200 mg by mouth as needed. (Patient not taking: Reported on 10/01/2024)   fluticasone  (FLONASE ) 50 MCG/ACT nasal spray Place 1 spray into both nostrils daily. (Patient not taking: Reported on 10/01/2024)   No facility-administered medications prior to visit.   "

## 2024-10-03 ENCOUNTER — Ambulatory Visit: Admitting: Cardiovascular Disease

## 2024-10-10 ENCOUNTER — Ambulatory Visit: Admitting: Cardiovascular Disease

## 2024-10-10 ENCOUNTER — Encounter: Payer: Self-pay | Admitting: Cardiovascular Disease

## 2024-10-10 VITALS — BP 110/68 | HR 59 | Ht 68.0 in | Wt 196.4 lb

## 2024-10-10 DIAGNOSIS — E870 Hyperosmolality and hypernatremia: Secondary | ICD-10-CM

## 2024-10-10 DIAGNOSIS — I471 Supraventricular tachycardia, unspecified: Secondary | ICD-10-CM

## 2024-10-10 DIAGNOSIS — I340 Nonrheumatic mitral (valve) insufficiency: Secondary | ICD-10-CM | POA: Diagnosis not present

## 2024-10-10 DIAGNOSIS — R002 Palpitations: Secondary | ICD-10-CM | POA: Diagnosis not present

## 2024-10-10 DIAGNOSIS — I10 Essential (primary) hypertension: Secondary | ICD-10-CM | POA: Diagnosis not present

## 2024-10-10 DIAGNOSIS — E785 Hyperlipidemia, unspecified: Secondary | ICD-10-CM

## 2024-10-10 DIAGNOSIS — R0609 Other forms of dyspnea: Secondary | ICD-10-CM

## 2024-10-10 DIAGNOSIS — E782 Mixed hyperlipidemia: Secondary | ICD-10-CM | POA: Diagnosis not present

## 2024-10-10 DIAGNOSIS — N1831 Chronic kidney disease, stage 3a: Secondary | ICD-10-CM

## 2024-11-19 ENCOUNTER — Ambulatory Visit: Admitting: Internal Medicine

## 2024-11-24 ENCOUNTER — Ambulatory Visit: Admitting: Cardiovascular Disease
